# Patient Record
Sex: Female | Born: 1990 | Race: White | Hispanic: No | Smoking: Former smoker
Health system: Southern US, Community
[De-identification: ages and names within clinical notes are randomized; demographics above are authoritative.]

## PROBLEM LIST (undated history)

## (undated) DIAGNOSIS — R519 Headache, unspecified: Secondary | ICD-10-CM

## (undated) DIAGNOSIS — G8929 Other chronic pain: Secondary | ICD-10-CM

## (undated) DIAGNOSIS — Z3480 Encounter for supervision of other normal pregnancy, unspecified trimester: Secondary | ICD-10-CM

## (undated) DIAGNOSIS — S82209A Unspecified fracture of shaft of unspecified tibia, initial encounter for closed fracture: Secondary | ICD-10-CM

## (undated) DIAGNOSIS — IMO0001 Reserved for inherently not codable concepts without codable children: Secondary | ICD-10-CM

## (undated) DIAGNOSIS — F319 Bipolar disorder, unspecified: Secondary | ICD-10-CM

## (undated) DIAGNOSIS — M549 Dorsalgia, unspecified: Secondary | ICD-10-CM

## (undated) DIAGNOSIS — S82409A Unspecified fracture of shaft of unspecified fibula, initial encounter for closed fracture: Secondary | ICD-10-CM

## (undated) DIAGNOSIS — R51 Headache: Secondary | ICD-10-CM

## (undated) HISTORY — PX: ABDOMINAL SURGERY: SHX537

## (undated) HISTORY — DX: Reserved for inherently not codable concepts without codable children: IMO0001

## (undated) HISTORY — DX: Bipolar disorder, unspecified: F31.9

## (undated) HISTORY — PX: OTHER SURGICAL HISTORY: SHX169

## (undated) HISTORY — PX: THERAPEUTIC ABORTION: SHX798

## (undated) HISTORY — DX: Encounter for supervision of other normal pregnancy, unspecified trimester: Z34.80

---
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
## 2010-10-02 ENCOUNTER — Emergency Department (HOSPITAL_COMMUNITY)
Admission: EM | Admit: 2010-10-02 | Discharge: 2010-10-02 | Disposition: A | Discharge status: Home / Self Care | Payer: Self-pay | Attending: Emergency Medicine | Admitting: Emergency Medicine

## 2010-10-02 ENCOUNTER — Encounter: Payer: Self-pay | Admitting: Emergency Medicine

## 2010-10-02 DIAGNOSIS — F17200 Nicotine dependence, unspecified, uncomplicated: Secondary | ICD-10-CM | POA: Insufficient documentation

## 2010-10-02 DIAGNOSIS — H539 Unspecified visual disturbance: Secondary | ICD-10-CM | POA: Insufficient documentation

## 2010-10-02 DIAGNOSIS — R51 Headache: Secondary | ICD-10-CM | POA: Insufficient documentation

## 2010-10-02 DIAGNOSIS — H53149 Visual discomfort, unspecified: Secondary | ICD-10-CM | POA: Insufficient documentation

## 2010-10-02 LAB — URINALYSIS, ROUTINE W REFLEX MICROSCOPIC
Leukocytes, UA: NEGATIVE
Nitrite: NEGATIVE
Specific Gravity, Urine: 1.02 (ref 1.005–1.030)
Urobilinogen, UA: 1 mg/dL (ref 0.0–1.0)

## 2010-10-02 LAB — PREGNANCY, URINE: Preg Test, Ur: NEGATIVE

## 2010-10-02 LAB — GLUCOSE, CAPILLARY: Glucose-Capillary: 114 mg/dL — ABNORMAL HIGH (ref 70–99)

## 2010-10-02 MED ORDER — KETOROLAC TROMETHAMINE 30 MG/ML IJ SOLN
30.0000 mg | Freq: Once | INTRAMUSCULAR | Status: AC
  Administered 2010-10-02: 30 mg via INTRAVENOUS
  Filled 2010-10-02: qty 1

## 2010-10-02 MED ORDER — SODIUM CHLORIDE 0.9 % IV BOLUS (SEPSIS)
1000.0000 mL | Freq: Once | INTRAVENOUS | Status: AC
  Administered 2010-10-02: 1000 mL via INTRAVENOUS

## 2010-10-02 MED ORDER — METOCLOPRAMIDE HCL 5 MG/ML IJ SOLN
10.0000 mg | Freq: Once | INTRAMUSCULAR | Status: AC
  Administered 2010-10-02: 10 mg via INTRAVENOUS
  Filled 2010-10-02: qty 2

## 2010-10-02 MED ORDER — DIPHENHYDRAMINE HCL 50 MG/ML IJ SOLN
25.0000 mg | Freq: Once | INTRAMUSCULAR | Status: AC
  Administered 2010-10-02: 25 mg via INTRAVENOUS
  Filled 2010-10-02: qty 1

## 2010-10-02 NOTE — ED Notes (Signed)
Pt ambulatory to restroom, urine sample obtained, sent to lab, pt state that the pain is better, rates as a 3, delay explained to pt,comfort measures provided.

## 2010-10-02 NOTE — ED Notes (Signed)
Patient with c/o headache. Patient reports she took ibuprofen and the headache has eased but intermittently worsens. Patient reports shaking. "I think I might be diabetic, because I drank a drink and have been shaky since" "I was also disoriented before I took the ibuprofen". Reporting chills, hot flashes as well. Patient alert/oriented in triage.

## 2010-10-02 NOTE — ED Notes (Signed)
Pt states that she thinks she may have went into a diabetic coma, pt is not diabetic, states that she has had a headache, nausea for a week or so, shaking, and hot flashes. States that she has felt run down lately due to being in school and working. Pt denies any pregnancy

## 2010-10-02 NOTE — ED Provider Notes (Signed)
Scribed for Aimee Octave, MD, the patient was seen in room APA03/APA03. This chart was scribed by [employer]. The patient's care started at 15:49  CSN: [id number] Arrival date & time: 10/02/2010  3:44 PM  Chief Complaint  Patient presents with  . Headache   HPI Aimee Santiago is a 20 y.o. female who presents to the Emergency Department complaining of Headaches onset 09/29/2010. She reports taking a dose of ibuprofen without relief. Patient states she's been having hot flashes for about a week, felt very nauseated this morning and having blurry vision. She reports current headaches are different from what she has at baseline. She notes that she only gets headaches after she drinks caffeine or sugar containing drinks. Headache is intermittent and worsening. Patient works in a hot environment at [employer] and stands for 10 hours at a time. She reports having a CT scan done on her head when she was 20 years old. She currently has no PCP and has not been seen for headaches. There are no other associated symptoms and no other alleviating or aggravating factors.    HPI ELEMENTS:  Location: head  Onset: 09/29/2010 Duration: 3 days  Timing: intermittent Modifying factors: not alleviated by ibuprofen Context:  as above  Associated symptoms: as above    History reviewed. No pertinent past medical history.  History reviewed. No pertinent past surgical history.  No family history on file.  History  Substance Use Topics  . Smoking status: Current Everyday Smoker  . Smokeless tobacco: Not on file  . Alcohol Use: Yes     occasionally     OB History    Grav Para Term Preterm Abortions TAB SAB Ect Mult Living                  Review of Systems  Constitutional: Positive for chills.  Eyes: Positive for photophobia and visual disturbance.  Neurological: Positive for headaches. Negative for speech difficulty and light-headedness.  All other systems reviewed and are  negative.    Physical Exam  BP 127/92  Pulse 96  Temp(Src) 98.3 F (36.8 C) (Oral)  Resp 20  Ht 5\' 1"  (1.549 m)  Wt 120 lb (54.432 kg)  BMI 22.67 kg/m2  SpO2 100%  LMP 10/02/2010  Physical Exam  Constitutional: She is oriented to person, place, and time. She appears well-developed and well-nourished. No distress.  HENT:  Head: Normocephalic and atraumatic.  Eyes: Conjunctivae and EOM are normal. Pupils are equal, round, and reactive to light.  Neck: Normal range of motion. Neck supple. No tracheal deviation present.  Cardiovascular: Normal rate and regular rhythm.   Pulmonary/Chest: Effort normal. No respiratory distress. She has no wheezes. She has no rales.  Abdominal: Soft. Bowel sounds are normal. She exhibits no distension. There is no tenderness. There is no rebound and no guarding.  Musculoskeletal: Normal range of motion. She exhibits no edema and no tenderness.  Neurological: She is alert and oriented to person, place, and time. She has normal strength. No cranial nerve deficit or sensory deficit. GCS eye subscore is 4. GCS verbal subscore is 5. GCS motor subscore is 6.       5/5 strength No meningismus  Skin: Skin is warm and dry. No rash noted. No erythema.  Psychiatric: She has a normal mood and affect. Her behavior is normal.     ED Course  Procedures  OTHER DATA REVIEWED: Nursing notes, vital signs, and past medical records reviewed.    DIAGNOSTIC STUDIES: Oxygen  Saturation is 100% on room air, normal by my interpretation.    LABS / RADIOLOGY:  Results for orders placed during the hospital encounter of 10/02/10  GLUCOSE, CAPILLARY      Component Value Range   Glucose-Capillary 114 (*) 70 - 99 (mg/dL)  URINALYSIS, ROUTINE W REFLEX MICROSCOPIC      Component Value Range   Color, Urine YELLOW  YELLOW    Appearance CLOUDY (*) CLEAR    Specific Gravity, Urine 1.020  1.005 - 1.030    pH 7.5  5.0 - 8.0    Glucose, UA NEGATIVE  NEGATIVE (mg/dL)   Hgb  urine dipstick NEGATIVE  NEGATIVE    Bilirubin Urine NEGATIVE  NEGATIVE    Ketones, ur TRACE (*) NEGATIVE (mg/dL)   Protein, ur NEGATIVE  NEGATIVE (mg/dL)   Urobilinogen, UA 1.0  0.0 - 1.0 (mg/dL)   Nitrite NEGATIVE  NEGATIVE    Leukocytes, UA NEGATIVE  NEGATIVE   PREGNANCY, URINE      Component Value Range   Preg Test, Ur NEGATIVE       ED COURSE / COORDINATION OF CARE: 16:10 - EDMD examined patient and ordered the following Orders Placed This Encounter  Procedures  . Glucose, capillary  . Urinalysis with microscopic  . Pregnancy, urine  . In and Out Cath  . Insert peripheral IV    MDM: intermittent headaches for the past month, similar in character.  Associated with hot flashes, nausea, photophobia. Noticed a headache onset today while drinking a caffeinated beverage.  States drinks two caffeinated drinks daily.  Normal neuro exam, no meningismus.   Symptomatic improvement after headache cocktail.  Normal neuro exam. Stable for outpatient followup.  MEDICATIONS GIVEN IN THE E.D.  Medications  ibuprofen (ADVIL,MOTRIN) 200 MG tablet (not administered)  sodium chloride 0.9 % bolus 1,000 mL (1000 mL Intravenous Given 10/02/10 1643)  diphenhydrAMINE (BENADRYL) injection 25 mg (25 mg Intravenous Given 10/02/10 1643)  ketorolac (TORADOL) injection 30 mg (30 mg Intravenous Given 10/02/10 1642)  metoCLOPramide (REGLAN) injection 10 mg (10 mg Intravenous Given 10/02/10 1644)    SCRIBE ATTESTATION: I personally performed the services described in this documentation, which was scribed in my presence.  The recorded information has been reviewed and considered.      Aimee Octave, MD 10/02/10 [phone]

## 2010-10-02 NOTE — ED Notes (Signed)
Pt self ambulated wih a steady gait out of the er stating no needs

## 2012-09-06 ENCOUNTER — Ambulatory Visit (INDEPENDENT_AMBULATORY_CARE_PROVIDER_SITE_OTHER): Payer: BC Managed Care – PPO | Admitting: *Deleted

## 2012-09-06 DIAGNOSIS — Z23 Encounter for immunization: Secondary | ICD-10-CM

## 2012-09-06 NOTE — Patient Instructions (Addendum)
Measles, Mumps, Rubella (MMR) Vaccine What You Need to Know WHY GET VACCINATED? Measles, mumps, and rubella are serious diseases. Before vaccines they were very common, especially among children. Measles  Measles virus causes rash, cough, runny nose, eye irritation, and fever.  It can lead to ear infection, pneumonia, seizures (jerking and staring), brain damage, and death. Mumps  Mumps virus causes fever, headache, muscle pain, loss of appetite, and swollen glands.  It can lead to deafness, meningitis (infection of the brain and spinal cord covering), painful swelling of the testicles or ovaries, and rarely sterility. Rubella (German Measles)  Rubella virus causes rash, arthritis (mostly in women), and mild fever.  If a woman gets rubella while she is pregnant, she could have a miscarriage or her baby could be born with serious birth defects. These diseases spread from person to person through the air. You can easily catch them by being around someone who is already infected. Measles, mumps, and rubella (MMR) vaccine can protect children (and adults) from all three of these diseases. Thanks to successful vaccination programs these diseases are much less common in the [place] than they used to be. But if we stopped vaccinating they would return.  WHO SHOULD GET MMR VACCINE AND WHEN? Children should get 2 doses of MMR vaccine:  First Dose: 12-15 months of age.  Second Dose: 4-6 years of age (may be given earlier, if at least 28 days after the 1st dose). Some infants younger than 12 months should get a dose of MMR if they are traveling out of the country. (This dose will not count toward their routine series.) Some adults should also get MMR vaccine: Generally, anyone 18 years of age or older who was born after 1958 should get at least one dose of MMR vaccine, unless they can show that they have had either been vaccinated or had all three diseases. MMR vaccine may be given at the same time  as other vaccines. Children between 1 and 12 years of age can get a "combination" vaccine called MMRV, which contains both MMR and varicella (chickenpox) vaccines. There is a separate Vaccine Information Statement for MMRV. SOME PEOPLE SHOULD NOT GET MMR VACCINE OR SHOULD WAIT  Anyone who has ever had a life-threatening allergic reaction to the antibiotic neomycin, or any other component of MMR vaccine, should not get the vaccine. Tell your doctor if you have any severe allergies.  Anyone who had a life-threatening allergic reaction to a previous dose of MMR or MMRV vaccine should not get another dose.  Some people who are sick at the time the shot is scheduled may be advised to wait until they recover before getting MMR vaccine.  Pregnant women should not get MMR vaccine. Pregnant women who need the vaccine should wait until after giving birth. Women should avoid getting pregnant for 4 weeks after vaccination with MMR vaccine.  Tell your doctor if the person getting the vaccine:  Has HIV/AIDS, or another disease that affects the immune system.  Is being treated with drugs that affect the immune system, such as steroids.  Has any kind of cancer.  Is being treated for cancer with radiation or drugs.  Has ever had a low platelet count (a blood disorder).  Has gotten another vaccine within the past 4 weeks.  Has recently had a transfusion or received other blood products. Any of these might be a reason to not get the vaccine, or delay vaccination until later. WHAT ARE THE RISKS FROM MMR VACCINE?    A vaccine, like any medicine, is capable of causing serious problems, such as severe allergic reactions.  The risk of MMR vaccine causing serious harm, or death, is extremely small.  Getting MMR vaccine is much safer than getting measles, mumps, or rubella.  Most people who get MMR vaccine do not have any serious problems with it. Mild Problems  Fever (up to 1 person out of 6).  Mild  rash (about 1 person out of 20).  Swelling of glands in the cheeks or neck (about 1 person out of 75). If these problems occur, it is usually within 6-14 days after the shot. They occur less often after the second dose. Moderate Problems  Seizure (jerking or staring) caused by fever (about 1 out of 3,000 doses).  Temporary pain and stiffness in the joints, mostly in teenage or adult women (up to 1 out of 4).  Temporary low platelet count, which can cause a bleeding disorder (about 1 out of 30,000 doses). Severe Problems (Very Rare)  Serious allergic reaction (less than 1 out of a million doses).  Several other severe problems have been reported after a child gets MMR vaccine, including:  Deafness.  Long-term seizures, coma, or lowered consciousness.  Permanent brain damage. These are so rare that it is hard to tell whether they are caused by the vaccine.  WHAT IF THERE IS A SERIOUS REACTION? What should I look for? Any unusual condition, such as a high fever or unusual behavior. Signs of a serious allergic reaction can include difficulty breathing, hoarseness or wheezing, hives, paleness, weakness, a fast heartbeat or dizziness. What should I do?  Call a doctor, or get the person to a doctor right away.  Tell your doctor what happened, the date and time it happened, and when the vaccination was given.  Ask your doctor to report the reaction by filing a Vaccine Adverse Event Reporting System (VAERS) form. Or, you can file this report through the VAERS website at www.vaers.hhs.gov or by calling 1-800-822-7967. VAERS does not provide medical advice. THE NATIONAL VACCINE INJURY COMPENSATION PROGRAM The National Vaccine Injury Compensation Program (VICP) was created in 1986. Persons who believe they may have been injured by a vaccine can learn about the program and about filing a claim by calling 1-800-338-2382 or visiting the VICP website at www.hrsa.gov/vaccinecompensation. HOW CAN  I LEARN MORE?  Ask your doctor.  Call your local or state health department.  Contact the Centers for Disease Control and Prevention (CDC):  Call 1-800-232-4636 (1-800-CDC-INFO)  or  Visit CDC's website at www.cdc.gov/vaccines CDC Measles, Mumps, and Rubella (MMR) Interim VIS (05/22/10) Document Released: 11/15/2005 Document Revised: 04/12/2011 Document Reviewed: 05/22/2010 [employer] Patient Information 2014 [employer]. Tetanus, Diphtheria, Pertussis (Tdap) Vaccine What You Need to Know WHY GET VACCINATED? Tetanus, diphtheria and pertussis can be very serious diseases, even for adolescents and adults. Tdap vaccine can protect us from these diseases. TETANUS (Lockjaw) causes painful muscle tightening and stiffness, usually all over the body.  It can lead to tightening of muscles in the head and neck so you can't open your mouth, swallow, or sometimes even breathe. Tetanus kills about 1 out of 5 people who are infected. DIPHTHERIA can cause a thick coating to form in the back of the throat.  It can lead to breathing problems, paralysis, heart failure, and death. PERTUSSIS (Whooping Cough) causes severe coughing spells, which can cause difficulty breathing, vomiting and disturbed sleep.  It can also lead to weight loss, incontinence, and rib fractures. Up to   2 in 100 adolescents and 5 in 100 adults with pertussis are hospitalized or have complications, which could include pneumonia and death. These diseases are caused by bacteria. Diphtheria and pertussis are spread from person to person through coughing or sneezing. Tetanus enters the body through cuts, scratches, or wounds. Before vaccines, the [place] saw as many as 200,000 cases a year of diphtheria and pertussis, and hundreds of cases of tetanus. Since vaccination began, tetanus and diphtheria have dropped by about 99% and pertussis by about 80%. TDAP VACCINE Tdap vaccine can protect adolescents and adults from tetanus,  diphtheria, and pertussis. One dose of Tdap is routinely given at age 11 or 12. People who did not get Tdap at that age should get it as soon as possible. Tdap is especially important for health care professionals and anyone having close contact with a baby younger than 12 months. Pregnant women should get a dose of Tdap during every pregnancy, to protect the newborn from pertussis. Infants are most at risk for severe, life-threatening complications from pertussis. A similar vaccine, called Td, protects from tetanus and diphtheria, but not pertussis. A Td booster should be given every 10 years. Tdap may be given as one of these boosters if you have not already gotten a dose. Tdap may also be given after a severe cut or burn to prevent tetanus infection. Your doctor can give you more information. Tdap may safely be given at the same time as other vaccines. SOME PEOPLE SHOULD NOT GET THIS VACCINE  If you ever had a life-threatening allergic reaction after a dose of any tetanus, diphtheria, or pertussis containing vaccine, OR if you have a severe allergy to any part of this vaccine, you should not get Tdap. Tell your doctor if you have any severe allergies.  If you had a coma, or long or multiple seizures within 7 days after a childhood dose of DTP or DTaP, you should not get Tdap, unless a cause other than the vaccine was found. You can still get Td.  Talk to your doctor if you:  have epilepsy or another nervous system problem,  had severe pain or swelling after any vaccine containing diphtheria, tetanus or pertussis,  ever had Guillain-Barr Syndrome (GBS),  aren't feeling well on the day the shot is scheduled. RISKS OF A VACCINE REACTION With any medicine, including vaccines, there is a chance of side effects. These are usually mild and go away on their own, but serious reactions are also possible. Brief fainting spells can follow a vaccination, leading to injuries from falling. Sitting or  lying down for about 15 minutes can help prevent these. Tell your doctor if you feel dizzy or light-headed, or have vision changes or ringing in the ears. Mild problems following Tdap (Did not interfere with activities)  Pain where the shot was given (about 3 in 4 adolescents or 2 in 3 adults)  Redness or swelling where the shot was given (about 1 person in 5)  Mild fever of at least 100.4F (up to about 1 in 25 adolescents or 1 in 100 adults)  Headache (about 3 or 4 people in 10)  Tiredness (about 1 person in 3 or 4)  Nausea, vomiting, diarrhea, stomach ache (up to 1 in 4 adolescents or 1 in 10 adults)  Chills, body aches, sore joints, rash, swollen glands (uncommon) Moderate problems following Tdap (Interfered with activities, but did not require medical attention)  Pain where the shot was given (about 1 in 5 adolescents or   1 in 100 adults)  Redness or swelling where the shot was given (up to about 1 in 16 adolescents or 1 in 25 adults)  Fever over 102F (about 1 in 100 adolescents or 1 in 250 adults)  Headache (about 3 in 20 adolescents or 1 in 10 adults)  Nausea, vomiting, diarrhea, stomach ache (up to 1 or 3 people in 100)  Swelling of the entire arm where the shot was given (up to about 3 in 100). Severe problems following Tdap (Unable to perform usual activities, required medical attention)  Swelling, severe pain, bleeding and redness in the arm where the shot was given (rare). A severe allergic reaction could occur after any vaccine (estimated less than 1 in a million doses). WHAT IF THERE IS A SERIOUS REACTION? What should I look for?  Look for anything that concerns you, such as signs of a severe allergic reaction, very high fever, or behavior changes. Signs of a severe allergic reaction can include hives, swelling of the face and throat, difficulty breathing, a fast heartbeat, dizziness, and weakness. These would start a few minutes to a few hours after the  vaccination. What should I do?  If you think it is a severe allergic reaction or other emergency that can't wait, call 9-1-1 or get the person to the nearest hospital. Otherwise, call your doctor.  Afterward, the reaction should be reported to the "Vaccine Adverse Event Reporting System" (VAERS). Your doctor might file this report, or you can do it yourself through the VAERS web site at www.vaers.hhs.gov, or by calling 1-800-822-7967. VAERS is only for reporting reactions. They do not give medical advice.  THE NATIONAL VACCINE INJURY COMPENSATION PROGRAM The National Vaccine Injury Compensation Program (VICP) is a federal program that was created to compensate people who may have been injured by certain vaccines. Persons who believe they may have been injured by a vaccine can learn about the program and about filing a claim by calling 1-800-338-2382 or visiting the VICP website at www.hrsa.gov/vaccinecompensation. HOW CAN I LEARN MORE?  Ask your doctor.  Call your local or state health department.  Contact the Centers for Disease Control and Prevention (CDC):  Call 1-800-232-4636 or visit CDC's website at www.cdc.gov/vaccines. CDC Tdap Vaccine VIS (06/10/11) Document Released: 07/20/2011 Document Revised: 10/13/2011 Document Reviewed: 07/20/2011 [employer] Patient Information 2014 [employer].  

## 2012-09-06 NOTE — Progress Notes (Signed)
Patient tolerated well.

## 2012-10-31 ENCOUNTER — Other Ambulatory Visit: Payer: BC Managed Care – PPO | Admitting: Nurse Practitioner

## 2012-12-20 ENCOUNTER — Emergency Department (HOSPITAL_COMMUNITY): Payer: No Typology Code available for payment source

## 2012-12-20 ENCOUNTER — Encounter (HOSPITAL_COMMUNITY): Payer: Self-pay | Admitting: Emergency Medicine

## 2012-12-20 ENCOUNTER — Emergency Department (HOSPITAL_COMMUNITY): Payer: Medicaid Other

## 2012-12-20 ENCOUNTER — Emergency Department (HOSPITAL_COMMUNITY)
Admission: EM | Admit: 2012-12-20 | Discharge: 2012-12-21 | Disposition: A | Discharge status: Home / Self Care | Payer: Medicaid Other | Attending: Emergency Medicine | Admitting: Emergency Medicine

## 2012-12-20 DIAGNOSIS — IMO0002 Reserved for concepts with insufficient information to code with codable children: Secondary | ICD-10-CM | POA: Insufficient documentation

## 2012-12-20 DIAGNOSIS — S0990XA Unspecified injury of head, initial encounter: Secondary | ICD-10-CM | POA: Insufficient documentation

## 2012-12-20 DIAGNOSIS — Y9389 Activity, other specified: Secondary | ICD-10-CM | POA: Insufficient documentation

## 2012-12-20 DIAGNOSIS — S8990XA Unspecified injury of unspecified lower leg, initial encounter: Secondary | ICD-10-CM | POA: Insufficient documentation

## 2012-12-20 DIAGNOSIS — Z3202 Encounter for pregnancy test, result negative: Secondary | ICD-10-CM | POA: Insufficient documentation

## 2012-12-20 DIAGNOSIS — Y92410 Unspecified street and highway as the place of occurrence of the external cause: Secondary | ICD-10-CM | POA: Insufficient documentation

## 2012-12-20 DIAGNOSIS — F17200 Nicotine dependence, unspecified, uncomplicated: Secondary | ICD-10-CM | POA: Insufficient documentation

## 2012-12-20 DIAGNOSIS — F1010 Alcohol abuse, uncomplicated: Secondary | ICD-10-CM | POA: Insufficient documentation

## 2012-12-20 LAB — POCT PREGNANCY, URINE: Preg Test, Ur: NEGATIVE

## 2012-12-20 NOTE — ED Notes (Signed)
Pt standing in hallway with gown unbuttoned and is yelling for a cigarette and to call her mom.  Went in to speak with pt and asked her to sit back down on bed.  Encouraged pt to cooperate for test and offered to call pt's mother for her.  Called [phone] and pt left a message for her mother.  Also called [phone] for pt to leave a message for another family member.  I also called mother's phone number and left a message. Pt to radiology.

## 2012-12-20 NOTE — ED Notes (Signed)
Patient removed c-collar herself. Patient was found to be up and walking in the hall with her gown un-buttoned. Patient removed herself from the cardiac monitor also.
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
## 2012-12-21 ENCOUNTER — Other Ambulatory Visit (HOSPITAL_COMMUNITY): Payer: Self-pay

## 2012-12-21 ENCOUNTER — Emergency Department (HOSPITAL_COMMUNITY): Payer: No Typology Code available for payment source

## 2012-12-21 ENCOUNTER — Emergency Department (HOSPITAL_COMMUNITY): Payer: Medicaid Other

## 2012-12-21 MED ORDER — HYDROCODONE-ACETAMINOPHEN 5-325 MG PO TABS: 2.0000 | ORAL_TABLET | ORAL | Status: DC | PRN

## 2012-12-21 NOTE — ED Provider Notes (Signed)
Medical screening examination/treatment/procedure(s) were performed by non-physician practitioner and as supervising physician I was immediately available for consultation/collaboration.  EKG Interpretation   None        Raeford Razor, MD 12/21/12 [phone]

## 2012-12-21 NOTE — ED Notes (Signed)
Attempted to call patients family at this time- no answer.
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
## 2012-12-21 NOTE — ED Notes (Signed)
Talked to patients mother on the phone- updated her on her daughters ED visit, condition and events leading up to ED visit. Mother stated that she would be here to pick up her daughter at approx 0730. Charge nurse and patient made aware.

## 2012-12-21 NOTE — ED Notes (Signed)
Schinlever, PA. Gave verbal order to wait to discharge patient until further notice.

## 2013-06-21 ENCOUNTER — Emergency Department (HOSPITAL_COMMUNITY): Payer: Medicaid Other

## 2013-06-21 ENCOUNTER — Emergency Department (HOSPITAL_COMMUNITY)
Admission: EM | Admit: 2013-06-21 | Discharge: 2013-06-21 | Disposition: A | Discharge status: Home / Self Care | Payer: Medicaid Other | Attending: Emergency Medicine | Admitting: Emergency Medicine

## 2013-06-21 ENCOUNTER — Encounter (HOSPITAL_COMMUNITY): Payer: Self-pay | Admitting: Emergency Medicine

## 2013-06-21 DIAGNOSIS — G8929 Other chronic pain: Secondary | ICD-10-CM | POA: Insufficient documentation

## 2013-06-21 DIAGNOSIS — O2390 Unspecified genitourinary tract infection in pregnancy, unspecified trimester: Secondary | ICD-10-CM | POA: Insufficient documentation

## 2013-06-21 DIAGNOSIS — B9689 Other specified bacterial agents as the cause of diseases classified elsewhere: Secondary | ICD-10-CM | POA: Insufficient documentation

## 2013-06-21 DIAGNOSIS — M545 Low back pain, unspecified: Secondary | ICD-10-CM | POA: Insufficient documentation

## 2013-06-21 DIAGNOSIS — Z3490 Encounter for supervision of normal pregnancy, unspecified, unspecified trimester: Secondary | ICD-10-CM

## 2013-06-21 DIAGNOSIS — N760 Acute vaginitis: Secondary | ICD-10-CM | POA: Insufficient documentation

## 2013-06-21 DIAGNOSIS — Z8781 Personal history of (healed) traumatic fracture: Secondary | ICD-10-CM | POA: Insufficient documentation

## 2013-06-21 DIAGNOSIS — A499 Bacterial infection, unspecified: Secondary | ICD-10-CM | POA: Insufficient documentation

## 2013-06-21 DIAGNOSIS — O99330 Smoking (tobacco) complicating pregnancy, unspecified trimester: Secondary | ICD-10-CM | POA: Insufficient documentation

## 2013-06-21 DIAGNOSIS — N949 Unspecified condition associated with female genital organs and menstrual cycle: Secondary | ICD-10-CM | POA: Insufficient documentation

## 2013-06-21 DIAGNOSIS — O98819 Other maternal infectious and parasitic diseases complicating pregnancy, unspecified trimester: Secondary | ICD-10-CM | POA: Insufficient documentation

## 2013-06-21 HISTORY — DX: Other chronic pain: G89.29

## 2013-06-21 HISTORY — DX: Headache, unspecified: R51.9

## 2013-06-21 HISTORY — DX: Headache: R51

## 2013-06-21 HISTORY — DX: Unspecified fracture of shaft of unspecified tibia, initial encounter for closed fracture: S82.209A

## 2013-06-21 HISTORY — DX: Dorsalgia, unspecified: M54.9

## 2013-06-21 HISTORY — DX: Unspecified fracture of shaft of unspecified fibula, initial encounter for closed fracture: S82.409A

## 2013-06-21 LAB — URINALYSIS, ROUTINE W REFLEX MICROSCOPIC
Bilirubin Urine: NEGATIVE
GLUCOSE, UA: NEGATIVE mg/dL
HGB URINE DIPSTICK: NEGATIVE
Ketones, ur: NEGATIVE mg/dL
LEUKOCYTES UA: NEGATIVE
Nitrite: NEGATIVE
PH: 6 (ref 5.0–8.0)
Protein, ur: NEGATIVE mg/dL
SPECIFIC GRAVITY, URINE: 1.02 (ref 1.005–1.030)
Urobilinogen, UA: 0.2 mg/dL (ref 0.0–1.0)

## 2013-06-21 LAB — OB RESULTS CONSOLE GC/CHLAMYDIA
CHLAMYDIA, DNA PROBE: NEGATIVE
GC PROBE AMP, GENITAL: NEGATIVE

## 2013-06-21 LAB — CBC
HCT: 34.5 % — ABNORMAL LOW (ref 36.0–46.0)
HEMOGLOBIN: 12 g/dL (ref 12.0–15.0)
MCH: 30.8 pg (ref 26.0–34.0)
MCHC: 34.8 g/dL (ref 30.0–36.0)
MCV: 88.7 fL (ref 78.0–100.0)
PLATELETS: 265 10*3/uL (ref 150–400)
RBC: 3.89 MIL/uL (ref 3.87–5.11)
RDW: 12.3 % (ref 11.5–15.5)
WBC: 12.1 10*3/uL — ABNORMAL HIGH (ref 4.0–10.5)

## 2013-06-21 LAB — HCG, QUANTITATIVE, PREGNANCY: HCG, BETA CHAIN, QUANT, S: 103418 m[IU]/mL — AB (ref <5)

## 2013-06-21 LAB — WET PREP, GENITAL
TRICH WET PREP: NONE SEEN
YEAST WET PREP: NONE SEEN

## 2013-06-21 LAB — PREGNANCY, URINE: PREG TEST UR: POSITIVE — AB

## 2013-06-21 LAB — ABO/RH: ABO/RH(D): AB POS

## 2013-06-21 MED ORDER — PRENATAL COMPLETE 14-0.4 MG PO TABS: 1.0000 | ORAL_TABLET | Freq: Every day | ORAL | Status: DC

## 2013-06-21 MED ORDER — METRONIDAZOLE 500 MG PO TABS: 500.0000 mg | ORAL_TABLET | Freq: Two times a day (BID) | ORAL | Status: DC

## 2013-06-21 NOTE — ED Notes (Signed)
abd pain for 1 week, Has not seen MD yet, is pregnant 2mos.    No vag bleeding, Nausea, vomiting.  Has had 2 abortions in past, last 2 years ago.  Concerned she may be having twins

## 2013-06-21 NOTE — Discharge Instructions (Signed)
°Emergency Department Resource Guide °1) Find a Doctor and Pay Out of Pocket °Although you won't have to find out who is covered by your insurance plan, it is a good idea to ask around and get recommendations. You will then need to call the office and see if the doctor you have chosen will accept you as a new patient and what types of options they offer for patients who are self-pay. Some doctors offer discounts or will set up payment plans for their patients who do not have insurance, but you will need to ask so you aren't surprised when you get to your appointment. ° °2) Contact Your Local Health Department °Not all health departments have doctors that can see patients for sick visits, but many do, so it is worth a call to see if yours does. If you don't know where your local health department is, you can check in your phone book. The CDC also has a tool to help you locate your state's health department, and many state websites also have listings of all of their local health departments. ° °3) Find a Walk-in Clinic °If your illness is not likely to be very severe or complicated, you may want to try a walk in clinic. These are popping up all over the country in pharmacies, drugstores, and shopping centers. They're usually staffed by nurse practitioners or physician assistants that have been trained to treat common illnesses and complaints. They're usually fairly quick and inexpensive. However, if you have serious medical issues or chronic medical problems, these are probably not your best option. ° °No Primary Care Doctor: °- Call Health Connect at  832-8000 - they can help you locate a primary care doctor that  accepts your insurance, provides certain services, etc. °- Physician Referral Service- 1-800-533-3463 ° °Chronic Pain Problems: °Organization         Address  Phone   Notes  °[facility]  (336) 297-2271 Patients need to be referred by their primary care doctor.  ° °Medication  Assistance: °Organization         Address  Phone   Notes  °[place] [facility] [address] °[place], [place] [address] (336) 641-8030 --Must be a resident of [place] °-- Must have NO insurance coverage whatsoever (no Medicaid/ Medicare, etc.) °-- The pt. MUST have a primary care doctor that directs their care regularly and follows them in the community °  °[facility]  (866) 331-1348   °[employer]  (888) 892-1162   ° °Agencies that provide inexpensive medical care: °Organization         Address  Phone   Notes  °[facility]  (336) 832-8035   °[facility]    (336) 832-7272   °[facility] [address] °[place], [place] [address] (336) 832-4777   °[facility] [address], °[place] (336) 271-4999   °[facility]    (336) 373-0678   °[facility]    (336) 272-1050   °[facility] ° [address], [place] Phone:  (336) 832-4444, Fax:  (336) 832-4440 Hours of Operation:  9 am - 6 pm, M-F.  Also accepts Medicaid/Medicare and self-pay.  °[facility] ° [address], [place] Phone: (336) 832-3150, Fax: (336) [phone]. Hours of Operation:  8:30 am - 5:30 pm, M-F.  Also accepts Medicaid and self-pay.  °[facility] [place] [address], [place] Phone: (336) 878-6027   °[facility] [address], [place], [place] (336)723-1848, Ext. 123 Mondays & Thursdays: 7-9 AM.  First 15 patients are seen on a first come, first serve basis. °  ° °Medicaid-accepting [place] Providers: ° °Organization         Address  Phone   Notes  °[facility] [address], [place] (336) 641-2100 Also accepts self-pay patients.  °[facility] [address], [place] ° (336) 856-9996   °[facility] [address], [place]  (336) 288-8857   °[facility] [address] [place] [address], [place] (336) 299-7000   °Veita Bland [address], [place]  ° (336) 373-1557 Only accepts [place] Access Medicaid patients after they have their name applied to their card.  ° °Self-Pay (no insurance) in [place]: ° °Organization         Address  Phone   Notes  °Sickle Cell Patients, [facility] [address], [place] (336) 832-1970   °[facility] [address], [place] (336) 832-4400   °[facility] ° [address], [place] (336) 992-4800   °[facility]/Dr. Osei-Bonsu ° [address] [place] [address], [place] or [address], [place] (336) 841-8500 Phone number for both [place] and [place] locations is the same.  °[facility] [address], [place] (336) 299-0000   °[facility] [address] [place] [address], [place] or [address] (336) 852-7530 °(336) 878-2260   °[facility] [address], [place] (336) 350-1642, phone; (336) 294-5005, fax Sees patients 1st and 3rd Saturday of every month.  Must not qualify for public or private insurance (i.e. Medicaid, Medicare, [place] Health Choice, Veterans' Benefits) • Household income should be no more than 200% of the poverty level •The clinic cannot treat you if you are pregnant or think you are pregnant • Sexually transmitted diseases are not treated at the clinic.  ° ° °Dental Care: °Organization         Address  Phone  Notes  °[place] [facility] [address], [place] (336) 641-6152 Accepts children up to age 21 who are enrolled in Medicaid or [place] Health Choice; pregnant women with a Medicaid card; and children who have applied for Medicaid or [place] Health Choice, but were declined, whose parents can pay a reduced fee at time of service.  °[place]  [facility] [place]  [address], [place] (336) 641-7733 Accepts children up to age 21 who are enrolled in Medicaid or [place] Health Choice; pregnant women with a Medicaid card; and children who have applied for Medicaid or [place] Health Choice, but were declined, whose parents can pay a reduced fee at time of service.  °[facility] ° [address], [place] (336) 641-4533 Patients are seen by appointment only. Walk-ins are not accepted. [facility] will see patients 18 years of age and older. °Monday - Tuesday (8am-5pm) °Most Wednesdays (8:30-5pm) °$30 per visit, cash only  °[facility] ° [address], [place] (336) 641-4533 Patients are seen by appointment only. Walk-ins are not accepted. [facility] will see patients 18 years of age and older. °One   Wednesday Evening (Monthly: Volunteer Based).  $30 per visit, cash only  °[facility]  (919) 537-3737 for adults; Children under age 4, call Graduate Pediatric Dentistry at ([phone]) [phone]. Children aged 4-14, please call (919) [phone] to request a pediatric application. ° Dental services are provided in all areas of dental care including fillings, crowns and bridges, complete and partial dentures, implants, gum treatment, root canals, and extractions. Preventive care is also provided. Treatment is provided to both adults and children. °Patients are selected via a lottery and there is often a waiting list. °  °[facility] [address], °[place] ° (336) 763-8833 [email or web address] °  °[facility] [address], [place], [place] (336)723-1848, Ext. 123 Second and Fourth Thursday of each month, opens at 6:30 AM; Clinic ends at 9 AM.  Patients are seen on a first-come first-served basis, and a limited number are seen during each clinic.  ° °[facility] ° [address], [place], [place] (336) 723-7904    Eligibility Requirements °You must have lived in [place], [place], or [place] counties for at least the last three months. °  You cannot be eligible for state or federal sponsored healthcare insurance, including Veterans Administration, Medicaid, or Medicare. °  You generally cannot be eligible for healthcare insurance through your employer.  °  How to apply: °Eligibility screenings are held every Tuesday and Wednesday afternoon from 1:00 pm until 4:00 pm. You do not need an appointment for the interview!  °[facility] [address], [place], [place] 336-631-2330   °[place] [facility]  336-342-8273   °[place]  336-703-3100   °[facility]  336-570-6415   ° °Behavioral Health Resources in the Community: °Intensive Outpatient Programs °Organization         Address  Phone  Notes  °[facility] [address], [place], [place] 336-878-6098   °[facility] [address], [place], [place] 336-832-9800   °[facility] [address], [place], [place] ° 336-882-2125   °[facility] [address],  °[place], [place] 1-800-853-5163 or 336-641-4981   °Substance Abuse Resources °Organization         Address  Phone  Notes  °[facility]  336-882-2125   °[facility]  336-784-9470   °[facility]  336-285-9073   °[facility]  336-845-3988   °Residential & Outpatient Substance Abuse Program  1-800-659-3381   °Psychological Services °Organization         Address  Phone  Notes  °[facility]  336- 832-9600   °[facility]  336- 378-7881   °[facility] [address], [place] 1-800-853-5163 or 336-641-4981   ° °Mobile Crisis Teams °Organization         Address  Phone  Notes  °[facility], Mobile Crisis Care Unit  1-877-626-1772   °Assertive °[facility] ° [address]  [place], [place] 336-834-9664   °Sharon DeEsch [address] °[place] 336-554-5454   ° °Self-Help/Support Groups °Organization         Address  Phone             Notes  °[facility]oc[facility] of  - variety of support groups  336- 373-1402 Call for more information  °Narcotics Anonymous (NA), [facility] [address], °[place]  2 meetings at this location  ° °  Residential Treatment Programs Organization         Address  Phone  Notes  [facility] [address],    [place]  [phone]   [facility]  [address], [place] [phone], [place], [place] [phone]   [facility] [address], [place] [phone] Admissions: 8am-3pm M-F  [facility] [address]    [place], [place] [phone]([phone]   [facility] [address], [place], [place] [phone]   [facility] [address]  [place], [place] [phone]) [phone]   [facility] Intensive Outpatient [address], [place], [place] [phone]   [facility] ([facility] [address]  [place], [place] [phone] or [phone]   [facility] ([facility] [address] [place], [place] [phone] Accepts Medicaid  [facility] [address]  [place] [phone] Substance Abuse/Addiction Treatment   [facility] Resources Organization         Address  Phone  Notes  [facility]  740-704-2893(888) [phone]   Angie FavaJulie Brannon, PhD [address], [address] [place], [place]   [phone](336) [phone] or ([phone](336) [phone]   [facility]   [address] [place], [place] [phone](336) [phone]   [facility] [address], [place], [place] [phone](336) [phone] Insurance/Medicaid/sponsorship through [facility]  [facility] [address]                                    [place], [place] [phone]) [phone] Therapy/tele-psych/case    [facility] [address]   [place], [place] [phone](336) [phone]    Dr. Lolly MustacheArfeen  [phone](336) [phone]   [facility] [place] [facility]  [employer] [facility] 1) [address], [place] 2) [address], [place] 3)  [address], [place] [phone](336) [phone](336) [phone]  [phone](336) [phone]   [facility] Child Abuse Hotline [phone](336) [phone] or ([phone](336) [phone] (After Hours)       Your ultrasound today showed a twin intrauterine pregnancy appoximately between 6710 and 4111 weeks old.  Avoid strenuous activity and do NOT place anything into your vagina, ie: no douching, no tampons, no sexual intercourse, no swimming or tub baths, until you are seen in follow up by your regular OB/GYN doctor.  Call your regular OB/GYN doctor tomorrow morning to schedule a follow up appointment on Monday.  Return to the Emergency Department immediately if worsening.
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
## 2013-06-21 NOTE — ED Notes (Signed)
Pt c/o generalized abdominal pain x1 week. Pt states "I think I'm 2 or 3 months pregnant". Pt denies vaginal bleeding or discharge. Pt also reports intermirttnet v/d.

## 2013-06-22 LAB — GC/CHLAMYDIA PROBE AMP
CT PROBE, AMP APTIMA: NEGATIVE
GC Probe RNA: NEGATIVE

## 2013-07-02 ENCOUNTER — Other Ambulatory Visit: Payer: Self-pay | Admitting: Obstetrics and Gynecology

## 2013-07-02 DIAGNOSIS — O30009 Twin pregnancy, unspecified number of placenta and unspecified number of amniotic sacs, unspecified trimester: Secondary | ICD-10-CM

## 2013-07-04 ENCOUNTER — Other Ambulatory Visit: Payer: Self-pay | Admitting: Obstetrics and Gynecology

## 2013-07-04 ENCOUNTER — Encounter: Payer: Self-pay | Admitting: Obstetrics and Gynecology

## 2013-07-04 ENCOUNTER — Ambulatory Visit (INDEPENDENT_AMBULATORY_CARE_PROVIDER_SITE_OTHER): Payer: Medicaid Other

## 2013-07-04 ENCOUNTER — Other Ambulatory Visit: Payer: Medicaid Other

## 2013-07-04 DIAGNOSIS — O09299 Supervision of pregnancy with other poor reproductive or obstetric history, unspecified trimester: Secondary | ICD-10-CM

## 2013-07-04 DIAGNOSIS — Z3480 Encounter for supervision of other normal pregnancy, unspecified trimester: Secondary | ICD-10-CM

## 2013-07-04 DIAGNOSIS — Z36 Encounter for antenatal screening of mother: Secondary | ICD-10-CM

## 2013-07-04 DIAGNOSIS — O30009 Twin pregnancy, unspecified number of placenta and unspecified number of amniotic sacs, unspecified trimester: Secondary | ICD-10-CM

## 2013-07-04 LAB — CBC
HEMATOCRIT: 35.1 % — AB (ref 36.0–46.0)
HEMOGLOBIN: 12.2 g/dL (ref 12.0–15.0)
MCH: 30.6 pg (ref 26.0–34.0)
MCHC: 34.8 g/dL (ref 30.0–36.0)
MCV: 88 fL (ref 78.0–100.0)
Platelets: 298 10*3/uL (ref 150–400)
RBC: 3.99 MIL/uL (ref 3.87–5.11)
RDW: 13.1 % (ref 11.5–15.5)
WBC: 12 10*3/uL — AB (ref 4.0–10.5)

## 2013-07-04 NOTE — Progress Notes (Signed)
U/S(12+6wks)-Twin IUP monochorionic/diamniotic, cx appears closed, bilateral adnexa appears WNL, ALL posterior Gr 0 placenta, +Membrane noted, pt desires NT/IT screening Baby (A)- active, FHR-161 bpm, CRL c/w dates, NB present, NT-1.63mm, fluid WNL Baby (B)-active FHR- 167 bpm, CRL c/w dates, NB present, NT-1.53mm, fluid WNL  

## 2013-07-04 NOTE — Progress Notes (Signed)
U/S(12+6wks)-Twin IUP monochorionic/diamniotic, cx appears closed, bilateral adnexa appears WNL, ALL posterior Gr 0 placenta, +Membrane noted, pt desires NT/IT screening Baby (A)- active, FHR-161 bpm, CRL c/w dates, NB present, NT-1.12mm, fluid WNL Baby (B)-active FHR- 167 bpm, CRL c/w dates, NB present, NT-1.50mm, fluid WNL

## 2013-07-05 LAB — RPR

## 2013-07-05 LAB — RUBELLA SCREEN: RUBELLA: 2.28 {index} — AB (ref <0.90)

## 2013-07-05 LAB — ABO AND RH: Rh Type: POSITIVE

## 2013-07-05 LAB — HEPATITIS B SURFACE ANTIGEN: HEP B S AG: NEGATIVE

## 2013-07-05 LAB — HIV ANTIBODY (ROUTINE TESTING W REFLEX): HIV: NONREACTIVE

## 2013-07-05 LAB — ANTIBODY SCREEN: Antibody Screen: NEGATIVE

## 2013-07-05 LAB — VARICELLA ZOSTER ANTIBODY, IGG: Varicella IgG: 923.6 Index — ABNORMAL HIGH (ref <135.00)

## 2013-07-09 ENCOUNTER — Encounter: Payer: Self-pay | Admitting: Adult Health

## 2013-07-24 ENCOUNTER — Encounter: Payer: Self-pay | Admitting: Adult Health

## 2013-07-24 ENCOUNTER — Ambulatory Visit (INDEPENDENT_AMBULATORY_CARE_PROVIDER_SITE_OTHER): Payer: Medicaid Other | Admitting: Adult Health

## 2013-07-24 ENCOUNTER — Other Ambulatory Visit (HOSPITAL_COMMUNITY)
Admission: RE | Admit: 2013-07-24 | Discharge: 2013-07-24 | Disposition: A | Discharge status: Home / Self Care | Payer: Medicaid Other | Source: Ambulatory Visit | Attending: Family Medicine | Admitting: Family Medicine

## 2013-07-24 VITALS — BP 98/60 | Wt 125.5 lb

## 2013-07-24 DIAGNOSIS — O30009 Twin pregnancy, unspecified number of placenta and unspecified number of amniotic sacs, unspecified trimester: Secondary | ICD-10-CM

## 2013-07-24 DIAGNOSIS — O09899 Supervision of other high risk pregnancies, unspecified trimester: Secondary | ICD-10-CM | POA: Insufficient documentation

## 2013-07-24 DIAGNOSIS — Z01419 Encounter for gynecological examination (general) (routine) without abnormal findings: Secondary | ICD-10-CM | POA: Insufficient documentation

## 2013-07-24 DIAGNOSIS — Z1389 Encounter for screening for other disorder: Secondary | ICD-10-CM

## 2013-07-24 DIAGNOSIS — IMO0001 Reserved for inherently not codable concepts without codable children: Secondary | ICD-10-CM

## 2013-07-24 DIAGNOSIS — Z113 Encounter for screening for infections with a predominantly sexual mode of transmission: Secondary | ICD-10-CM | POA: Insufficient documentation

## 2013-07-24 DIAGNOSIS — Z3482 Encounter for supervision of other normal pregnancy, second trimester: Secondary | ICD-10-CM

## 2013-07-24 DIAGNOSIS — Z3480 Encounter for supervision of other normal pregnancy, unspecified trimester: Secondary | ICD-10-CM

## 2013-07-24 DIAGNOSIS — Z331 Pregnant state, incidental: Secondary | ICD-10-CM

## 2013-07-24 HISTORY — DX: Reserved for inherently not codable concepts without codable children: IMO0001

## 2013-07-24 HISTORY — DX: Encounter for supervision of other normal pregnancy, unspecified trimester: Z34.80

## 2013-07-24 LAB — URINALYSIS
BILIRUBIN URINE: NEGATIVE
Glucose, UA: NEGATIVE mg/dL
Hgb urine dipstick: NEGATIVE
Ketones, ur: NEGATIVE mg/dL
Leukocytes, UA: NEGATIVE
Nitrite: NEGATIVE
PH: 7 (ref 5.0–8.0)
PROTEIN: NEGATIVE mg/dL
SPECIFIC GRAVITY, URINE: 1.006 (ref 1.005–1.030)
Urobilinogen, UA: 0.2 mg/dL (ref 0.0–1.0)

## 2013-07-24 LAB — POCT URINALYSIS DIPSTICK
Glucose, UA: NEGATIVE
Ketones, UA: NEGATIVE
Leukocytes, UA: NEGATIVE
Nitrite, UA: NEGATIVE
Protein, UA: NEGATIVE
RBC UA: NEGATIVE

## 2013-07-24 MED ORDER — PRENATAL COMPLETE 14-0.4 MG PO TABS: 1.0000 | ORAL_TABLET | Freq: Every day | ORAL | Status: DC
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
## 2013-07-24 NOTE — Patient Instructions (Addendum)
Second Trimester of Pregnancy The second trimester is from week 13 through week 28, months 4 through 6. The second trimester is often a time when you feel your best. Your body has also adjusted to being pregnant, and you begin to feel better physically. Usually, morning sickness has lessened or quit completely, you may have more energy, and you may have an increase in appetite. The second trimester is also a time when the fetus is growing rapidly. At the end of the sixth month, the fetus is about 9 inches long and weighs about 1 pounds. You will likely begin to feel the baby move (quickening) between 18 and 20 weeks of the pregnancy. BODY CHANGES Your body goes through many changes during pregnancy. The changes vary from woman to woman.   Your weight will continue to increase. You will notice your lower abdomen bulging out.  You may begin to get stretch marks on your hips, abdomen, and breasts.  You may develop headaches that can be relieved by medicines approved by your health care provider.  You may urinate more often because the fetus is pressing on your bladder.  You may develop or continue to have heartburn as a result of your pregnancy.  You may develop constipation because certain hormones are causing the muscles that push waste through your intestines to slow down.  You may develop hemorrhoids or swollen, bulging veins (varicose veins).  You may have back pain because of the weight gain and pregnancy hormones relaxing your joints between the bones in your pelvis and as a result of a shift in weight and the muscles that support your balance.  Your breasts will continue to grow and be tender.  Your gums may bleed and may be sensitive to brushing and flossing.  Dark spots or blotches (chloasma, mask of pregnancy) may develop on your face. This will likely fade after the baby is born.  A dark line from your belly button to the pubic area (linea nigra) may appear. This will likely fade  after the baby is born.  You may have changes in your hair. These can include thickening of your hair, rapid growth, and changes in texture. Some women also have hair loss during or after pregnancy, or hair that feels dry or thin. Your hair will most likely return to normal after your baby is born. WHAT TO EXPECT AT YOUR PRENATAL VISITS During a routine prenatal visit:  You will be weighed to make sure you and the fetus are growing normally.  Your blood pressure will be taken.  Your abdomen will be measured to track your baby's growth.  The fetal heartbeat will be listened to.  Any test results from the previous visit will be discussed. Your health care provider may ask you:  How you are feeling.  If you are feeling the baby move.  If you have had any abnormal symptoms, such as leaking fluid, bleeding, severe headaches, or abdominal cramping.  If you have any questions. Other tests that may be performed during your second trimester include:  Blood tests that check for:  Low iron levels (anemia).  Gestational diabetes (between 24 and 28 weeks).  Rh antibodies.  Urine tests to check for infections, diabetes, or protein in the urine.  An ultrasound to confirm the proper growth and development of the baby.  An amniocentesis to check for possible genetic problems.  Fetal screens for spina bifida and Down syndrome. HOME CARE INSTRUCTIONS   Avoid all smoking, herbs, alcohol, and unprescribed   drugs. These chemicals affect the formation and growth of the baby.  Follow your health care provider's instructions regarding medicine use. There are medicines that are either safe or unsafe to take during pregnancy.  Exercise only as directed by your health care provider. Experiencing uterine cramps is a good sign to stop exercising.  Continue to eat regular, healthy meals.  Wear a good support bra for breast tenderness.  Do not use hot tubs, steam rooms, or saunas.  Wear your  seat belt at all times when driving.  Avoid raw meat, uncooked cheese, cat litter boxes, and soil used by cats. These carry germs that can cause birth defects in the baby.  Take your prenatal vitamins.  Try taking a stool softener (if your health care provider approves) if you develop constipation. Eat more high-fiber foods, such as fresh vegetables or fruit and whole grains. Drink plenty of fluids to keep your urine clear or pale yellow.  Take warm sitz baths to soothe any pain or discomfort caused by hemorrhoids. Use hemorrhoid cream if your health care provider approves.  If you develop varicose veins, wear support hose. Elevate your feet for 15 minutes, 3-4 times a day. Limit salt in your diet.  Avoid heavy lifting, wear low heel shoes, and practice good posture.  Rest with your legs elevated if you have leg cramps or low back pain.  Visit your dentist if you have not gone yet during your pregnancy. Use a soft toothbrush to brush your teeth and be gentle when you floss.  A sexual relationship may be continued unless your health care provider directs you otherwise.  Continue to go to all your prenatal visits as directed by your health care provider. SEEK MEDICAL CARE IF:   You have dizziness.  You have mild pelvic cramps, pelvic pressure, or nagging pain in the abdominal area.  You have persistent nausea, vomiting, or diarrhea.  You have a bad smelling vaginal discharge.  You have pain with urination. SEEK IMMEDIATE MEDICAL CARE IF:   You have a fever.  You are leaking fluid from your vagina.  You have spotting or bleeding from your vagina.  You have severe abdominal cramping or pain.  You have rapid weight gain or loss.  You have shortness of breath with chest pain.  You notice sudden or extreme swelling of your face, hands, ankles, feet, or legs.  You have not felt your baby move in over an hour.  You have severe headaches that do not go away with  medicine.  You have vision changes. Document Released: 01/12/2001 Document Revised: 01/23/2013 Document Reviewed: 03/21/2012 [facility] Patient Information 2015 [place]. This information is not intended to replace advice given to you by your health care provider. Make sure you discuss any questions you have with your health care provider. Return in 3 weeks for US and see Dr Despina HiddenEure

## 2013-07-25 LAB — DRUG SCREEN, URINE, NO CONFIRMATION
Amphetamine Screen, Ur: NEGATIVE
BENZODIAZEPINES.: NEGATIVE
Barbiturate Quant, Ur: NEGATIVE
CREATININE, U: 30.3 mg/dL
Cocaine Metabolites: NEGATIVE
METHADONE: NEGATIVE
Marijuana Metabolite: NEGATIVE
Opiate Screen, Urine: NEGATIVE
PHENCYCLIDINE (PCP): NEGATIVE
PROPOXYPHENE: NEGATIVE

## 2013-07-25 LAB — OXYCODONE SCREEN, UA, RFLX CONFIRM: Oxycodone Screen, Ur: NEGATIVE ng/mL

## 2013-07-26 LAB — URINE CULTURE
COLONY COUNT: NO GROWTH
ORGANISM ID, BACTERIA: NO GROWTH

## 2013-07-26 LAB — MATERNAL SCREEN, INTEGRATED #1

## 2013-07-27 LAB — CYSTIC FIBROSIS DIAGNOSTIC STUDY

## 2013-07-27 LAB — CYTOLOGY - PAP

## 2013-07-30 ENCOUNTER — Encounter: Payer: Self-pay | Admitting: Adult Health

## 2013-07-31 LAB — MATERNAL SCREEN, INTEGRATED #2
AFP MOM MAT SCREEN: 1.43
AFP, Serum: 48.4 ng/mL
Age risk Down Syndrome: 1:1000 {titer}
CROWN RUMP LENGTH MAT SCREEN 2: 65.6 mm
Calculated Gestational Age: 15.4
Estriol Mom: 1.59
Estriol, Free: 1.2 ng/mL
HCG, MOM MAT SCREEN: 2.04
HCG, SERUM MAT SCREEN: 91.3 [IU]/mL
INHIBIN A DIMERIC MAT SCREEN: 304 pg/mL
Inhibin A MoM: 1.57
MSS Down Syndrome: <1:5000 {titer}
NT MoM: 1.1
NUCHAL TRANSLUCENCY MAT SCREEN 2: 1.63 mm
Number of fetuses: 2
PAPP-A MOM MAT SCREEN: 1.55
PAPP-A: 1261 ng/mL

## 2013-08-01 ENCOUNTER — Encounter: Payer: Self-pay | Admitting: Adult Health

## 2013-08-06 ENCOUNTER — Other Ambulatory Visit: Payer: Self-pay | Admitting: Adult Health

## 2013-08-06 DIAGNOSIS — O30009 Twin pregnancy, unspecified number of placenta and unspecified number of amniotic sacs, unspecified trimester: Secondary | ICD-10-CM

## 2013-08-14 ENCOUNTER — Ambulatory Visit (INDEPENDENT_AMBULATORY_CARE_PROVIDER_SITE_OTHER): Payer: Medicaid Other | Admitting: Obstetrics & Gynecology

## 2013-08-14 ENCOUNTER — Ambulatory Visit (INDEPENDENT_AMBULATORY_CARE_PROVIDER_SITE_OTHER): Payer: Medicaid Other

## 2013-08-14 ENCOUNTER — Other Ambulatory Visit: Payer: Self-pay | Admitting: Adult Health

## 2013-08-14 VITALS — BP 110/76 | Wt 131.0 lb

## 2013-08-14 DIAGNOSIS — O30009 Twin pregnancy, unspecified number of placenta and unspecified number of amniotic sacs, unspecified trimester: Secondary | ICD-10-CM

## 2013-08-14 DIAGNOSIS — IMO0001 Reserved for inherently not codable concepts without codable children: Secondary | ICD-10-CM

## 2013-08-14 DIAGNOSIS — Z331 Pregnant state, incidental: Secondary | ICD-10-CM

## 2013-08-14 DIAGNOSIS — O09899 Supervision of other high risk pregnancies, unspecified trimester: Secondary | ICD-10-CM

## 2013-08-14 DIAGNOSIS — O09299 Supervision of pregnancy with other poor reproductive or obstetric history, unspecified trimester: Secondary | ICD-10-CM

## 2013-08-14 DIAGNOSIS — Z1389 Encounter for screening for other disorder: Secondary | ICD-10-CM

## 2013-08-14 LAB — POCT URINALYSIS DIPSTICK
Blood, UA: NEGATIVE
Glucose, UA: NEGATIVE
KETONES UA: NEGATIVE
LEUKOCYTES UA: NEGATIVE
NITRITE UA: NEGATIVE
PROTEIN UA: NEGATIVE

## 2013-08-14 MED ORDER — METRONIDAZOLE 500 MG PO TABS: 500.0000 mg | ORAL_TABLET | Freq: Two times a day (BID) | ORAL | Status: DC

## 2013-08-14 MED ORDER — PRENATAL COMPLETE 14-0.4 MG PO TABS: 1.0000 | ORAL_TABLET | Freq: Every day | ORAL | Status: DC

## 2013-08-14 NOTE — Progress Notes (Signed)
U/S(18+5wks) Twin IUP Monochorionic Diamniotic +Membrane noted, cx appears closed (3.1cm), bilateral adnexa appears WNL, all posterior Gr  0 placenta Baby A-vtx active fetus (caudal and to maternal Lt) , meas c/w dates, fluid WNL, FHR-144 BPM, no obvious abnl noted, female fetus Baby B- vtx active fetus (cephalic and to maternal Rt), meas c/w dates, fluid WNL, FHR-153 bpm, 2VC noted, Lt Vent EICF noted no other abnl noted, female fetus

## 2013-08-14 NOTE — Addendum Note (Signed)
Addended by: Lazaro ArmsEURE, Waylon Hershey H on: 08/14/2013 03:35 PM   Modules accepted: Orders

## 2013-08-14 NOTE — Progress Notes (Signed)
Sonogram is read reviewed and report done High Risk Pregnancy Diagnosis(es):   Twins  G3P0020 2421w5d Estimated Date of Delivery: 01/10/14  Blood pressure 110/76, weight 131 lb (59.421 kg), last menstrual period 04/05/2013.  Urinalysis: Negative   HPI: Sonogram discussed     BP weight and urine results all reviewed and noted. Patient reports good fetal movement, denies any bleeding and no rupture of membranes symptoms or regular contractions.  Fundal Height:  24  Fetal Heart rate:  144 153 Edema:  none  Patient is without complaints. All questions were answered.  Assessment:  3621w5d,   twins  Medication(s) Plans:  na  Treatment Plan:  Per usual twins  Follow up in 4 weeks for OB appt,

## 2013-09-11 ENCOUNTER — Encounter: Payer: Self-pay | Admitting: Obstetrics & Gynecology

## 2013-09-11 ENCOUNTER — Ambulatory Visit (INDEPENDENT_AMBULATORY_CARE_PROVIDER_SITE_OTHER): Payer: Medicaid Other | Admitting: Obstetrics & Gynecology

## 2013-09-11 VITALS — BP 120/72 | Wt 141.0 lb

## 2013-09-11 DIAGNOSIS — Z331 Pregnant state, incidental: Secondary | ICD-10-CM

## 2013-09-11 DIAGNOSIS — O30009 Twin pregnancy, unspecified number of placenta and unspecified number of amniotic sacs, unspecified trimester: Secondary | ICD-10-CM

## 2013-09-11 DIAGNOSIS — Z1389 Encounter for screening for other disorder: Secondary | ICD-10-CM

## 2013-09-11 LAB — POCT URINALYSIS DIPSTICK
Blood, UA: NEGATIVE
Glucose, UA: NEGATIVE
Ketones, UA: NEGATIVE
LEUKOCYTES UA: NEGATIVE
NITRITE UA: NEGATIVE
Protein, UA: NEGATIVE

## 2013-09-11 NOTE — Progress Notes (Signed)
High Risk Pregnancy Diagnosis(es):   Di/Mono twins  G3P0020 5148w5d Estimated Date of Delivery: 01/10/14  Blood pressure 120/72, weight 141 lb (63.957 kg), last menstrual period 04/05/2013.  Urinalysis: Negative   HPI: C/o left rib pain     BP weight and urine results all reviewed and noted. Patient reports good fetal movement, denies any bleeding and no rupture of membranes symptoms or regular contractions.  Fundal Height:  32 Fetal Heart rate:  145/155 Edema:  none  Patient is without complaints. All questions were answered.  Assessment:  7148w5d,   twins  Medication(s) Plans:  na  Treatment Plan:  Pt reminded to make plans to stop work at 28 weeks Monthly sonogram and twice weekly surveillance beginning at 32 weeks  Follow up in 4 weeks for OB appt, ob visit

## 2013-09-11 NOTE — Progress Notes (Signed)
Pt wants to discuss a problem she is having with her left side/rib area.

## 2013-09-20 ENCOUNTER — Encounter (HOSPITAL_COMMUNITY): Payer: Self-pay | Admitting: Pediatrics

## 2013-09-20 ENCOUNTER — Inpatient Hospital Stay (HOSPITAL_COMMUNITY)
Admission: AD | Admit: 2013-09-20 | Discharge: 2013-10-03 | DRG: 765 | Disposition: A | Discharge status: Home / Self Care | Payer: Medicaid Other | Source: Ambulatory Visit | Attending: Obstetrics & Gynecology | Admitting: Obstetrics & Gynecology

## 2013-09-20 ENCOUNTER — Encounter (HOSPITAL_COMMUNITY): Payer: Self-pay | Admitting: Anesthesiology

## 2013-09-20 ENCOUNTER — Telehealth: Payer: Self-pay | Admitting: Advanced Practice Midwife

## 2013-09-20 DIAGNOSIS — O4700 False labor before 37 completed weeks of gestation, unspecified trimester: Secondary | ICD-10-CM | POA: Diagnosis present

## 2013-09-20 DIAGNOSIS — Z22330 Carrier of Group B streptococcus: Secondary | ICD-10-CM

## 2013-09-20 DIAGNOSIS — O99344 Other mental disorders complicating childbirth: Secondary | ICD-10-CM | POA: Diagnosis present

## 2013-09-20 DIAGNOSIS — O30032 Twin pregnancy, monochorionic/diamniotic, second trimester: Secondary | ICD-10-CM

## 2013-09-20 DIAGNOSIS — O9989 Other specified diseases and conditions complicating pregnancy, childbirth and the puerperium: Secondary | ICD-10-CM

## 2013-09-20 DIAGNOSIS — O3090 Multiple gestation, unspecified, unspecified trimester: Secondary | ICD-10-CM | POA: Diagnosis present

## 2013-09-20 DIAGNOSIS — O30009 Twin pregnancy, unspecified number of placenta and unspecified number of amniotic sacs, unspecified trimester: Secondary | ICD-10-CM | POA: Diagnosis present

## 2013-09-20 DIAGNOSIS — F1210 Cannabis abuse, uncomplicated: Secondary | ICD-10-CM | POA: Diagnosis present

## 2013-09-20 DIAGNOSIS — Z98891 History of uterine scar from previous surgery: Secondary | ICD-10-CM

## 2013-09-20 DIAGNOSIS — F319 Bipolar disorder, unspecified: Secondary | ICD-10-CM | POA: Diagnosis present

## 2013-09-20 DIAGNOSIS — Z3689 Encounter for other specified antenatal screening: Secondary | ICD-10-CM

## 2013-09-20 DIAGNOSIS — O99892 Other specified diseases and conditions complicating childbirth: Secondary | ICD-10-CM | POA: Diagnosis present

## 2013-09-20 DIAGNOSIS — O99334 Smoking (tobacco) complicating childbirth: Secondary | ICD-10-CM | POA: Diagnosis present

## 2013-09-20 DIAGNOSIS — O09899 Supervision of other high risk pregnancies, unspecified trimester: Secondary | ICD-10-CM

## 2013-09-20 DIAGNOSIS — O329XX0 Maternal care for malpresentation of fetus, unspecified, not applicable or unspecified: Secondary | ICD-10-CM

## 2013-09-20 DIAGNOSIS — O30049 Twin pregnancy, dichorionic/diamniotic, unspecified trimester: Secondary | ICD-10-CM

## 2013-09-20 DIAGNOSIS — O4290 Premature rupture of membranes, unspecified as to length of time between rupture and onset of labor, unspecified weeks of gestation: Secondary | ICD-10-CM | POA: Diagnosis present

## 2013-09-20 DIAGNOSIS — IMO0001 Reserved for inherently not codable concepts without codable children: Secondary | ICD-10-CM

## 2013-09-20 MED ORDER — MAGNESIUM SULFATE 40 G IN LACTATED RINGERS - SIMPLE
2.0000 g/h | INTRAVENOUS | Status: DC
  Administered 2013-09-20 – 2013-09-21 (×2): 4 g/h via INTRAVENOUS
  Administered 2013-09-22 – 2013-09-23 (×2): 2 g/h via INTRAVENOUS
  Filled 2013-09-20 (×4): qty 500

## 2013-09-20 MED ORDER — TERBUTALINE SULFATE 1 MG/ML IJ SOLN
INTRAMUSCULAR | Status: AC
  Administered 2013-09-20: 23:00:00
  Filled 2013-09-20: qty 1

## 2013-09-20 MED ORDER — TERBUTALINE SULFATE 1 MG/ML IJ SOLN
0.2500 mg | Freq: Once | INTRAMUSCULAR | Status: DC
  Filled 2013-09-20: qty 1

## 2013-09-20 MED ORDER — LACTATED RINGERS IV SOLN: 500.0000 mL | INTRAVENOUS | Status: DC | PRN

## 2013-09-20 MED ORDER — OXYTOCIN 40 UNITS IN LACTATED RINGERS INFUSION - SIMPLE MED: 62.5000 mL/h | INTRAVENOUS | Status: DC

## 2013-09-20 MED ORDER — ONDANSETRON HCL 4 MG/2ML IJ SOLN: 4.0000 mg | Freq: Four times a day (QID) | INTRAMUSCULAR | Status: DC | PRN

## 2013-09-20 MED ORDER — NIFEDIPINE 10 MG PO CAPS
10.0000 mg | ORAL_CAPSULE | Freq: Three times a day (TID) | ORAL | Status: DC
  Administered 2013-09-21 – 2013-09-23 (×10): 10 mg via ORAL
  Filled 2013-09-20 (×10): qty 1

## 2013-09-20 MED ORDER — IBUPROFEN 600 MG PO TABS: 600.0000 mg | ORAL_TABLET | Freq: Four times a day (QID) | ORAL | Status: DC | PRN

## 2013-09-20 MED ORDER — INDOMETHACIN 25 MG PO CAPS
50.0000 mg | ORAL_CAPSULE | Freq: Once | ORAL | Status: AC
  Administered 2013-09-20: 50 mg via ORAL
  Filled 2013-09-20: qty 2

## 2013-09-20 MED ORDER — BETAMETHASONE SOD PHOS & ACET 6 (3-3) MG/ML IJ SUSP
12.0000 mg | Freq: Once | INTRAMUSCULAR | Status: AC
  Administered 2013-09-20: 12 mg via INTRAMUSCULAR
  Filled 2013-09-20: qty 2

## 2013-09-20 MED ORDER — SODIUM CHLORIDE 0.9 % IV SOLN
2.0000 g | Freq: Once | INTRAVENOUS | Status: AC
  Administered 2013-09-20: 2 g via INTRAVENOUS
  Filled 2013-09-20: qty 2000

## 2013-09-20 MED ORDER — LACTATED RINGERS IV SOLN
INTRAVENOUS | Status: DC
  Administered 2013-09-22 – 2013-09-30 (×15): via INTRAVENOUS

## 2013-09-20 MED ORDER — INDOMETHACIN 25 MG PO CAPS
25.0000 mg | ORAL_CAPSULE | Freq: Four times a day (QID) | ORAL | Status: DC
  Administered 2013-09-21 – 2013-09-23 (×9): 25 mg via ORAL
  Filled 2013-09-20 (×10): qty 1

## 2013-09-20 MED ORDER — MAGNESIUM SULFATE BOLUS VIA INFUSION
6.0000 g | Freq: Once | INTRAVENOUS | Status: DC
  Filled 2013-09-20: qty 500

## 2013-09-20 MED ORDER — ACETAMINOPHEN 325 MG PO TABS: 650.0000 mg | ORAL_TABLET | ORAL | Status: DC | PRN

## 2013-09-20 MED ORDER — CITRIC ACID-SODIUM CITRATE 334-500 MG/5ML PO SOLN
30.0000 mL | ORAL | Status: DC | PRN
Start: 2013-09-20 — End: 2013-09-30
  Administered 2013-09-30: 30 mL via ORAL
  Filled 2013-09-20: qty 15

## 2013-09-20 MED ORDER — LIDOCAINE HCL (PF) 1 % IJ SOLN: 30.0000 mL | INTRAMUSCULAR | Status: DC | PRN

## 2013-09-20 MED ORDER — OXYTOCIN BOLUS FROM INFUSION: 500.0000 mL | INTRAVENOUS | Status: DC

## 2013-09-20 MED ORDER — OXYCODONE-ACETAMINOPHEN 5-325 MG PO TABS: 1.0000 | ORAL_TABLET | ORAL | Status: DC | PRN

## 2013-09-20 NOTE — MAU Note (Signed)
Pt. sts she started having cramping around 1400 today. Then at 1700 she started feeling strong contractions.

## 2013-09-20 NOTE — Anesthesia Preprocedure Evaluation (Deleted)
Anesthesia Evaluation  Patient identified by MRN, date of birth, ID band Patient awake    Reviewed: Allergy & Precautions, H&P , Patient's Chart, lab work & pertinent test results  Airway Mallampati: II TM Distance: >3 FB Neck ROM: full    Dental   Pulmonary Current Smoker,  breath sounds clear to auscultation        Cardiovascular Rhythm:regular Rate:Normal     Neuro/Psych  Headaches, PSYCHIATRIC DISORDERS    GI/Hepatic   Endo/Other    Renal/GU      Musculoskeletal   Abdominal   Peds  Hematology   Anesthesia Other Findings Chronic headaches     Chronic back pain         Bipolar 1 disorder  Twins (breech vertex)  Reproductive/Obstetrics (+) Pregnancy                           Anesthesia Physical Anesthesia Plan  ASA: II  Anesthesia Plan: Epidural   Post-op Pain Management:    Induction:   Airway Management Planned:   Additional Equipment:   Intra-op Plan:   Post-operative Plan:   Informed Consent: I have reviewed the patients History and Physical, chart, labs and discussed the procedure including the risks, benefits and alternatives for the proposed anesthesia with the patient or authorized representative who has indicated his/her understanding and acceptance.     Plan Discussed with:   Anesthesia Plan Comments:         Anesthesia Quick Evaluation

## 2013-09-20 NOTE — Telephone Encounter (Signed)
Spoke with pt. Pt states she is having pain in vagina, period cramps, and a tightness in her belly. Also, leaking fluid. + baby movement. No bleeding. I [facility] pt [facility]. Pt states her husband will be home in about 1 hour and she can go then. Until then, I advised pt to push fluids, and lay on left side. Pt voiced understanding. JSY
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
## 2013-09-21 LAB — TYPE AND SCREEN
ABO/RH(D): AB POS
Antibody Screen: NEGATIVE

## 2013-09-21 LAB — CBC
HCT: 27 % — ABNORMAL LOW (ref 36.0–46.0)
HEMOGLOBIN: 9.3 g/dL — AB (ref 12.0–15.0)
MCH: 32.2 pg (ref 26.0–34.0)
MCHC: 34.4 g/dL (ref 30.0–36.0)
MCV: 93.4 fL (ref 78.0–100.0)
Platelets: 252 10*3/uL (ref 150–400)
RBC: 2.89 MIL/uL — ABNORMAL LOW (ref 3.87–5.11)
RDW: 13.5 % (ref 11.5–15.5)
WBC: 25.9 10*3/uL — ABNORMAL HIGH (ref 4.0–10.5)

## 2013-09-21 LAB — RAPID URINE DRUG SCREEN, HOSP PERFORMED
Amphetamines: NOT DETECTED
BARBITURATES: NOT DETECTED
Benzodiazepines: NOT DETECTED
Cocaine: NOT DETECTED
Opiates: NOT DETECTED
Tetrahydrocannabinol: POSITIVE — AB

## 2013-09-21 LAB — OB RESULTS CONSOLE GC/CHLAMYDIA
Chlamydia: NEGATIVE
Gonorrhea: NEGATIVE

## 2013-09-21 LAB — ABO/RH: ABO/RH(D): AB POS

## 2013-09-21 LAB — HIV ANTIBODY (ROUTINE TESTING W REFLEX): HIV 1&2 Ab, 4th Generation: NONREACTIVE

## 2013-09-21 MED ORDER — NICOTINE 7 MG/24HR TD PT24
7.0000 mg | MEDICATED_PATCH | Freq: Every day | TRANSDERMAL | Status: DC
  Administered 2013-09-21 – 2013-10-03 (×12): 7 mg via TRANSDERMAL
  Filled 2013-09-21 (×13): qty 1

## 2013-09-21 MED ORDER — PENICILLIN G POTASSIUM 5000000 UNITS IJ SOLR: 2.5000 10*6.[IU] | INTRAVENOUS | Status: DC

## 2013-09-21 MED ORDER — DOCUSATE SODIUM 100 MG PO CAPS
100.0000 mg | ORAL_CAPSULE | Freq: Two times a day (BID) | ORAL | Status: DC | PRN
  Administered 2013-09-21 – 2013-09-28 (×7): 100 mg via ORAL
  Filled 2013-09-21 (×7): qty 1

## 2013-09-21 MED ORDER — STERILE WATER FOR INJECTION IJ SOLN: 0.4000 mL | Freq: Once | INTRAMUSCULAR | Status: DC

## 2013-09-21 MED ORDER — BETAMETHASONE SOD PHOS & ACET 6 (3-3) MG/ML IJ SUSP
12.0000 mg | Freq: Once | INTRAMUSCULAR | Status: AC
  Administered 2013-09-21: 12 mg via INTRAMUSCULAR
  Filled 2013-09-21: qty 2

## 2013-09-21 MED ORDER — BETAMETHASONE SOD PHOS & ACET 6 (3-3) MG/ML IJ SUSP: 12.0000 mg | Freq: Once | INTRAMUSCULAR | Status: DC

## 2013-09-21 MED ORDER — PENICILLIN G POTASSIUM 5000000 UNITS IJ SOLR: 5.0000 10*6.[IU] | Freq: Once | INTRAMUSCULAR | Status: DC

## 2013-09-21 MED ORDER — PENICILLIN G POTASSIUM 5000000 UNITS IJ SOLR
5.0000 10*6.[IU] | Freq: Once | INTRAVENOUS | Status: AC
  Administered 2013-09-21: 5 10*6.[IU] via INTRAVENOUS
  Filled 2013-09-21: qty 5

## 2013-09-21 MED ORDER — PENICILLIN G POTASSIUM 5000000 UNITS IJ SOLR
2.5000 10*6.[IU] | INTRAVENOUS | Status: DC
  Administered 2013-09-21 – 2013-09-27 (×33): 2.5 10*6.[IU] via INTRAVENOUS
  Filled 2013-09-21 (×36): qty 2.5

## 2013-09-21 NOTE — Progress Notes (Signed)
Contractions have slowed down considerably, now about every 9-15 minutes.  FHRs are difficult to trace d/t gestational age, but they are both reassuring.  Dr. Eric FormWimmer in to talk with pt.

## 2013-09-21 NOTE — Consult Note (Addendum)
Asked by Dr.Dove to provide prenatal consultation for 23 yo G3 P 0 mother at 6024 wks EGA with twins who was brought to [facility] by EMS after she had onset contractions.  Bulging membranes and advanced cervical dilatation noted on OB's exam, but contractions slowed after she was treated with Mg, terubtaline, and indomethacin and she is being treated with betamethasone and ampicillin. Twin A vertex, twin B breech (per ultrasound).  Discussed usual expectations for preterm infants at [redacted] weeks gestation, including possible needs for DR resuscitation, respiratory support, IV access, and blood products.  Also presented risks of death or serious morbidity, including lung damage and neurodevelopmental delays.  Projected possible length of stay in NICU until 36 - [redacted] wks EGA.  Discussed advantages of feeding with mother's milk.  She plans to pump postnatally.  Patient was attentive, concerned, had appropriate questions, and was appreciative of my input.  She expressed desire to delay delivery as long as possible.  Thank you for the consultation.  Total time 20 minutes (all face-to-face)  Add: subsequently learned twin B has 2 vessel cord and echogenicintracardiac focus

## 2013-09-21 NOTE — Progress Notes (Signed)
CNM and resident at bedside with bedside US--fequent fetal movement noted--difficult to keep fetus on continuous fetal monitoring--good heart rate visualized--continue to try and monitor FHR

## 2013-09-21 NOTE — Progress Notes (Signed)
Pt reports mild irregular contractions from 15-3460mins

## 2013-09-21 NOTE — Progress Notes (Signed)
Attending in department--aware of difficultly with FHR tracing--provider ok with intermittent tracing-- reports no concerns of fetal wellbeing

## 2013-09-21 NOTE — Progress Notes (Signed)
   Erby Pianngel V Acocella is a 23 y.o. G3P0020 at 6118w1d  admitted for active labor  Subjective:  contractions have spaced out considerably, now reportedly q 15-60 minutes  Objective: Filed Vitals:   09/21/13 0701 09/21/13 0731 09/21/13 0801 09/21/13 0831  BP: 112/60 112/54 114/61 116/87  Pulse: 95 99 104 107  Temp:   98 F (36.7 C)   TempSrc:   Oral   Resp: 20 16 16 16   Height:      Weight:      SpO2:          FHT:  FHR: 140 bpm, variability: moderate,  accelerations:  Abscent,  decelerations:  Absent  Still difficult to trace continuously, but reassuring for GA UC:   rare SVE:    deferred   Labs: Lab Results  Component Value Date   WBC 25.9* 09/21/2013   HGB 9.3* 09/21/2013   HCT 27.0* 09/21/2013   MCV 93.4 09/21/2013   PLT 252 09/21/2013    Assessment / Plan: PTL, arrested Will give 2nd dose of BMZ at 12 hours Continue tocolytics/trendelenburg Add stool softner SW consult d/t social issues  Labor: no Fetal Wellbeing:  Category I Pain Control:  Labor support without medications Anticipated MOD:  SVD  CRESENZO-DISHMAN,Demica Zook 09/21/2013, 9:01 AM

## 2013-09-21 NOTE — Progress Notes (Signed)
Rate not increased after discussing with K. Clelia CroftShaw CNM

## 2013-09-21 NOTE — Progress Notes (Signed)
Magneisum changed to 4gm per order- was currently at 2gm

## 2013-09-22 LAB — RPR

## 2013-09-22 NOTE — Progress Notes (Signed)
RN requested CSW intervention for support.  Patient is [redacted] weeks pregnant with twins and concerned about being evicted from their home.  Met with patient at length.  Attempted to have her refocus her thoughts on the babies instead of her situation.  She is very preoccupied with what is going on with her boyfriend, losing her stuff in the apartment, boyfriend's father who started living with them about 2 months ago, and how she looks in the hospital.  Her mood was slightly elevated and her speech was pressured at times.   Discussed her options if evicted.  Patient states that she can always depend on her mother, although she didn't want to.  Although she was never told by this Probation officer that she was being investigation.  She mentioned several times during the conversation her concern with being investigated, and her babies being taken away from her.  Reiterated this writer's role several times.  She was vague about "being investigated".  She admits to hx of marijuana, but was very vague and did seem to want to talk about it.  It was mentioned in the chart that patient called EMS to bring her to the hospital because "all the family was under the influence".  Questioned mother about this, and she said it was a misunderstanding.  Patient became tearful several times during the conversation and repeatedly encouraged to focus on the pregnancy instead of all the stressors in her life.  She would quickly change topics and become overwhelmed.  Discussed techniques used in the past to keep her calm, and lower her anxiety.   Encouraged her to use those techniques.  She reports hx of bipolar during teenage years, and states that she was on medication but they didn't work.  She also reports hx of psychiatric hospitalization when she was a teenager.  Provided supportive feedback and extensive support.  Informed her of CSW availability.

## 2013-09-22 NOTE — Progress Notes (Signed)
Talked with Gerlene Burdockichard of [employer] and [employer] after the pt confided to this RN that she is to be evicted in 2 days, has no support systems other than her FOB and they are currently fighting, has no car and no job and [place] and is having trouble reseting her current court dates.

## 2013-09-22 NOTE — Progress Notes (Addendum)
Continue to adjust monitors.  Good fetal movement audible.

## 2013-09-23 NOTE — Progress Notes (Signed)
Pt states she seems to be feeling more contractions that are painful. Dr Despina Hidden made aware and states we are doing everything at this time to stop contractions. Pt made aware. Will continue to monitor and follow closely.

## 2013-09-23 NOTE — Progress Notes (Signed)
At bedside tracing babies good fetal movement audible. Also, mom noted to be moaning thru some contractions. States that she is starting to feel contractions more frequently since 1800 but are short in duration.  Feels more pain in rectal and vaginal areas but is not stronger than before.  Hilda Lias, CNM made aware.  No change in POC at this time.  Pt made aware. Will continue to monitor closely.

## 2013-09-23 NOTE — Progress Notes (Signed)
RN has been at bedside continuously trying to obtain NST for >1 hour

## 2013-09-24 LAB — GC/CHLAMYDIA PROBE AMP
CT PROBE, AMP APTIMA: NEGATIVE
GC Probe RNA: NEGATIVE

## 2013-09-24 MED ORDER — TERBUTALINE SULFATE 1 MG/ML IJ SOLN
0.2500 mg | Freq: Once | INTRAMUSCULAR | Status: AC
  Administered 2013-09-24: 0.25 mg via SUBCUTANEOUS

## 2013-09-24 MED ORDER — INDOMETHACIN 50 MG PO CAPS
100.0000 mg | ORAL_CAPSULE | Freq: Once | ORAL | Status: AC
  Administered 2013-09-24: 100 mg via ORAL
  Filled 2013-09-24: qty 2

## 2013-09-24 MED ORDER — INDOMETHACIN 25 MG PO CAPS
25.0000 mg | ORAL_CAPSULE | ORAL | Status: AC
  Administered 2013-09-24 – 2013-09-27 (×18): 25 mg via ORAL
  Filled 2013-09-24 (×18): qty 1

## 2013-09-24 MED ORDER — NIFEDIPINE ER 30 MG PO TB24
30.0000 mg | ORAL_TABLET | Freq: Two times a day (BID) | ORAL | Status: DC
  Administered 2013-09-24 – 2013-09-30 (×13): 30 mg via ORAL
  Filled 2013-09-24 (×16): qty 1

## 2013-09-24 NOTE — Progress Notes (Signed)
Care transferred from Jobe Marker, RN to Woody Seller, RN

## 2013-09-24 NOTE — Progress Notes (Addendum)
Patient ID: Aimee Santiago, female   DOB: 08/20/1990, 23 y.o.   MRN: [id number] Patient found eating breakfast. She reports persistent contractions unchanged since admission  TOCO: ctx q-6-7 minutes VE: bulging bag at +2 station. Did not attempt to feel for cervical dilation or vertex station  A/P 23 yo with TIUP at 24.4 with preterm labor - Case discussed with MFM Particia Nearing)- will discontinue Magnesium sulfate - Will continue procardia for tocolysis - Continue current care and close monitoring

## 2013-09-24 NOTE — Progress Notes (Signed)
Patient ID: Aimee Santiago, female   DOB: 02/05/1990, 23 y.o.   MRN: [id number] Doing OK  States feels contractions a "little more than before, but not a lot more"  Filed Vitals:   09/23/13 2001 09/23/13 2102 09/23/13 2201 09/24/13 0000  BP: 103/53 100/77 119/75   Pulse: 104 101 110   Temp:  98.7 F (37.1 C)    TempSrc:  Axillary    Resp: Height:      Weight:      SpO2:       FHR tracings reviewed from 8-9pm Both twins reassuring with good variability and even some accels  UCs have been around every 6-8 minutes  States feels no rectal pressure  Will continue to observe. Dr Despina Hidden aware of continued contractions Will avoid unnecessary vaginal exams

## 2013-09-24 NOTE — Progress Notes (Signed)
Patient ID: Aimee Santiago, female   DOB: 15-Sep-1990, 23 y.o.   MRN: [id number] Doing OK but states UCs hurt some more  Had a large BM this morning. Uncomfortable with urination on bedpan  UCs every 5-7 minutes  Discussed with Dr Despina Hidden  Will try a dose of Terbutaline

## 2013-09-24 NOTE — Progress Notes (Signed)
I visited with Aimee Santiago and provided refelctive listening for over an hour as she relayed some important life events leading up to this hospitalization.  She is going through a very difficult time financially and she is currently in the process of moving. Her boyfriend (they've been together for 7 years) is trying to take care of the details of the move now that she is in the hospital.  Her mother has been very supportive during this time, but they have had relationship difficulties in the past.  Aimee Santiago appears to be in a period of shock and has some anxiety about being here with her babies so early.  She is asking theological questions about why this is happening and trying to make sense of her story.  She feels that her babies have saved her life and she did state that before she found out she was pregnant, she had suicidal ideation.  She reports that since the pregnancy, she has had no thought of that because her babies have given her a reason to live.  She spoke very openly with me both with her mother in the room and then alone for a short period when her mother stepped out to give [place] some privacy.  Her mother requested prayer and she was grateful for the prayer.  We will continue to follow up with her, but please also page as needs arise.  [employer] Pager, [phone] 4:05 PM   09/24/13 1500  Clinical Encounter Type  Visited With Patient;Patient and family together  Visit Type Spiritual support  Referral From (Safety Rounds)  Spiritual Encounters  Spiritual Needs Emotional;Prayer  Stress Factors  Patient Stress Factors Loss of control;Financial concerns;Major life changes;Family relationships

## 2013-09-25 ENCOUNTER — Encounter (HOSPITAL_COMMUNITY): Payer: Self-pay | Admitting: *Deleted

## 2013-09-25 ENCOUNTER — Inpatient Hospital Stay (HOSPITAL_COMMUNITY): Payer: Medicaid Other

## 2013-09-25 DIAGNOSIS — O4700 False labor before 37 completed weeks of gestation, unspecified trimester: Secondary | ICD-10-CM

## 2013-09-25 DIAGNOSIS — O30009 Twin pregnancy, unspecified number of placenta and unspecified number of amniotic sacs, unspecified trimester: Secondary | ICD-10-CM

## 2013-09-25 NOTE — Progress Notes (Signed)
Patient ID: Aimee Santiago, female   DOB: 31-Oct-1990, 23 y.o.   MRN: [id number] Patient is comfortable this morning. She reports spaced out contractions since initiation of Procardia. No other concerning symptoms.  FHR for A&B early this morning: 145 bpm, minimal variability, small accelerations, variable decelerations TOCO: ctx 1-3/per hour VE: bulging bag at +2 station as per last exam by Dr. Jolayne Panther  A/P 23 yo with  Di/di twin IUP at [redacted]w[redacted]d with preterm labor - Will continue Indocin and Procardia for tocolysis - Continue current care and close monitoring. FHR reassuring and appropriate for GA. - Vertex/Breech presentation; vaginal delivery anticipated in the event of progressing PTL   Aimee Collins, MD, FACOG Attending Obstetrician & Gynecologist Faculty Practice, [facility]

## 2013-09-25 NOTE — Progress Notes (Signed)
Pt's mother asking if it is possible to get vouchers or other assistance for motel stay for father of baby or for herself due to lengthy hospital stay for patient & expected lengthy NICU stay for babies. Called hospital social worker and left message pertaining to above.

## 2013-09-26 NOTE — Progress Notes (Addendum)
   CAMMI CONSALVO is a 23 y.o. G3P0020 at [redacted]w[redacted]d  admitted for active labor with intact membranes  Subjective: Eating well. No pain, contractions q 10 minutes per pt. No LOF. Some pink/red discharge overnight.   Objective: Filed Vitals:   09/26/13 0058 09/26/13 0459 09/26/13 0907 09/26/13 1216  BP: 101/56 126/74 122/69   Pulse: 99 109 100   Temp: 98.4 F (36.9 C) 98.6 F (37 C) 98.6 F (37 C) 97.8 F (36.6 C)  TempSrc: Oral Oral  Oral  Resp: Height:      Weight:      SpO2:          FHT: A: 155, mod variability, no accels (appropriately), no decels B: 155, mod variability, no accels (appropriately), no decels UC:   regular, every 10 minutes SVE:   Dilation:  (Undeterminable) Exam by:: Dr Jolayne Panther  Labs: Lab Results  Component Value Date   WBC 25.9* 09/21/2013   HGB 9.3* 09/21/2013   HCT 27.0* 09/21/2013   MCV 93.4 09/21/2013   PLT 252 09/21/2013    Assessment / Plan: BRYTNEE BECHLER is a 23 y.o. G3P0020 at [redacted]w[redacted]d presenting with SOL fully dilated from previous exams. Membranes intact   Labor: Continue on Indocin, procardia Fetal Wellbeing:  Cat 1 ID: PCN for GBS positive Joanna Puff 09/26/2013, 2:20 PM     I have seen this patient and agree with the above resident's note.  Reviewed plan of care with Dr Debroah Loop.  Plan for pt to remain on Birthing Suites at this time.    LEFTWICH-KIRBY, LISA Certified Nurse-Midwife

## 2013-09-26 NOTE — Progress Notes (Signed)
Attempted follow-up visit with Aimee Santiago, but she was not able to speak at the time.  We will continue to check in on her, but please also page as needs arise.  [address] [place] Pager, [phone] 4:14 PM   09/26/13 1600  Clinical Encounter Type  Visited With Patient  Visit Type Follow-up

## 2013-09-26 NOTE — Progress Notes (Signed)
Bedbath given.  Washed hair.  Pt states "I feel so much better>"

## 2013-09-27 ENCOUNTER — Inpatient Hospital Stay (HOSPITAL_COMMUNITY): Payer: Medicaid Other

## 2013-09-27 DIAGNOSIS — O4290 Premature rupture of membranes, unspecified as to length of time between rupture and onset of labor, unspecified weeks of gestation: Secondary | ICD-10-CM

## 2013-09-27 DIAGNOSIS — O09899 Supervision of other high risk pregnancies, unspecified trimester: Secondary | ICD-10-CM

## 2013-09-27 LAB — TYPE AND SCREEN
ABO/RH(D): AB POS
ABO/RH(D): AB POS
ANTIBODY SCREEN: NEGATIVE
Antibody Screen: NEGATIVE

## 2013-09-27 LAB — AMNISURE RUPTURE OF MEMBRANE (ROM) NOT AT ARMC: Amnisure ROM: POSITIVE

## 2013-09-27 MED ORDER — INDOMETHACIN 25 MG PO CAPS
25.0000 mg | ORAL_CAPSULE | ORAL | Status: DC
  Administered 2013-09-27 – 2013-09-30 (×18): 25 mg via ORAL
  Filled 2013-09-27 (×24): qty 1

## 2013-09-27 MED ORDER — SODIUM CHLORIDE 0.9 % IV SOLN
2.0000 g | Freq: Four times a day (QID) | INTRAVENOUS | Status: AC
  Administered 2013-09-27 – 2013-09-29 (×8): 2 g via INTRAVENOUS
  Filled 2013-09-27 (×9): qty 2000

## 2013-09-27 MED ORDER — SODIUM CHLORIDE 0.9 % IV SOLN
250.0000 mg | Freq: Four times a day (QID) | INTRAVENOUS | Status: AC
  Administered 2013-09-27 – 2013-09-29 (×8): 250 mg via INTRAVENOUS
  Filled 2013-09-27 (×8): qty 5

## 2013-09-27 NOTE — Progress Notes (Signed)
CNM reviewing FHR strip

## 2013-09-27 NOTE — Progress Notes (Signed)
Patient ID: [name], female   DOB: 1990/09/23, 23 y.o.   MRN: [id number]  Reviewed case with Dr. Sherrie George MFM aw well as [place] images.  On [place] there does not appear to be any fluid around the baby's head which is in the vagina.  Last cervical exam was bulging membranes.  Discussed the possibility of the head being through the cervix with the cervix contracted near the neck.  Images are not able to show this area well.  It was decided to do vaginal exam and try to feel for cervix and it's dilation.  This was reviewed with the patient who understands the need for the exam.    On exam, there are no membranes felt and head is in vagina.  The cervix feels to be adequately dilated as not not cause obstruction if emergency c/s was necessary.  Pt feels to be ruptured.  Pt is having small amount of vaginal bleeding which will make amniosure inaccurate.  Will treat was PPROM and start antibiotics.  Will also continue indocin as this is the tocolytic she responds to best.  Will monitor white count and abdominal tenderness for signs of chorio.  Indocin can mask a fever so will will be vigilant in monitoring for other signs of chorio.

## 2013-09-27 NOTE — Progress Notes (Signed)
09/27/13 1300  Clinical Encounter Type  Visited With Patient and family together ([place] A.J.)  Visit Type Follow-up;Spiritual support;Social support  Referral From Chaplain (Dyanne Carrel, MSM)  Spiritual Encounters  Spiritual Needs Emotional  Stress Factors  Patient Stress Factors Loss of control;Major life changes;Financial concerns   Visited with Aimee Santiago and Aimee Santiago for nearly two hours this morning/afternoon, as she shared and processed about Facilities manager" and concerns.  Aimee Santiago is motivated by education, which is a significant part of her self-understanding and sense of family identity.  Per pt, finances, legal matters, education, and the excitement/challenge of caring for twins are all stressors.  Served as witness to her story, reflection, and meaning-making, providing pastoral presence, empathic listening, and spiritual companionship.  Aimee Santiago will follow for spiritual/emotional support, but please also page as needs arise.  Thank you.  [address] [place], [place] [phone]

## 2013-09-28 DIAGNOSIS — O30039 Twin pregnancy, monochorionic/diamniotic, unspecified trimester: Secondary | ICD-10-CM

## 2013-09-28 DIAGNOSIS — O30009 Twin pregnancy, unspecified number of placenta and unspecified number of amniotic sacs, unspecified trimester: Secondary | ICD-10-CM

## 2013-09-28 LAB — COMPREHENSIVE METABOLIC PANEL
ALBUMIN: 2.3 g/dL — AB (ref 3.5–5.2)
ALK PHOS: 103 U/L (ref 39–117)
ALT: 8 U/L (ref 0–35)
ANION GAP: 11 (ref 5–15)
AST: 8 U/L (ref 0–37)
BUN: 9 mg/dL (ref 6–23)
CHLORIDE: 100 meq/L (ref 96–112)
CO2: 25 mEq/L (ref 19–32)
Calcium: 8.3 mg/dL — ABNORMAL LOW (ref 8.4–10.5)
Creatinine, Ser: 0.46 mg/dL — ABNORMAL LOW (ref 0.50–1.10)
GFR calc Af Amer: >90 mL/min (ref >90)
GFR calc non Af Amer: >90 mL/min (ref >90)
Glucose, Bld: 83 mg/dL (ref 70–99)
Potassium: 3.9 mEq/L (ref 3.7–5.3)
SODIUM: 136 meq/L — AB (ref 137–147)
TOTAL PROTEIN: 5.3 g/dL — AB (ref 6.0–8.3)
Total Bilirubin: <0.2 mg/dL — ABNORMAL LOW (ref 0.3–1.2)

## 2013-09-28 LAB — CBC
HCT: 27.1 % — ABNORMAL LOW (ref 36.0–46.0)
Hemoglobin: 9.1 g/dL — ABNORMAL LOW (ref 12.0–15.0)
MCH: 31.4 pg (ref 26.0–34.0)
MCHC: 33.6 g/dL (ref 30.0–36.0)
MCV: 93.4 fL (ref 78.0–100.0)
PLATELETS: 276 10*3/uL (ref 150–400)
RBC: 2.9 MIL/uL — ABNORMAL LOW (ref 3.87–5.11)
RDW: 12.9 % (ref 11.5–15.5)
WBC: 19.1 10*3/uL — ABNORMAL HIGH (ref 4.0–10.5)

## 2013-09-28 NOTE — Progress Notes (Signed)
Attending Progress Note  Aimee Santiago is a 23 y.o. G3P0020 at [redacted]w[redacted]d  with di/di twins admitted for active labor; diagnosed with PPROM on 09/27/13.  Subjective: Eating well. No pain, contractions irregular per pt. No LOF. Some pink/red discharge overnight.   Objective: Filed Vitals:   09/27/13 1152 09/27/13 1547 09/27/13 2006 09/28/13 0610  BP: 125/84 121/71 125/76   Pulse: 103 94 94   Temp: 97.8 F (36.6 C) 97.9 F (36.6 C) 98.4 F (36.9 C) 98.2 F (36.8 C)  TempSrc: Oral Oral Oral Oral  Resp: Height:      Weight:      SpO2:        FHT: A: 145, mod variability, no accels (appropriately), no decels B: 145, mod variability, no accels (appropriately), no decels UC:   irregular, every 10 minutes SVE:   Dilation:  (Undeterminable) Exam by:: Dr. Penne Lash  Labs: Lab Results  Component Value Date   WBC 19.1* 09/28/2013   HGB 9.1* 09/28/2013   HCT 27.1* 09/28/2013   MCV 93.4 09/28/2013   PLT 276 09/28/2013    Assessment / Plan: PPROM at [redacted]w[redacted]d with di/di twins Continue latency antibiotics Will consult MFM today about continuing/discontinuing tocolyitcs ([place] and Indocin) given PPROM Continue inpatient close observation   [facility] A, MD 09/28/2013, 7:18 AM

## 2013-09-28 NOTE — Progress Notes (Signed)
NICU Note:  Spoke with mother regarding questions related to prematurity and NICU stay.  She has spoken with Dr. Eric Form on 8/21 but wanted some clarification regarding survival statistics, length of stay, feedings etc.  John Giovanni, DO  Neonatologist

## 2013-09-28 NOTE — Progress Notes (Signed)
Follow up Spiritual Care Visit  Aimee Santiago has many stressers in her life at present. She is a person who feels the need to be productive and accomplish more and more. Being on bed rest is its own stresser, but she balances this by remembering she is doing this for her twins. She reports she has not been an active member of a community of faith or any other spiritual body, but is a spiritual person who finds meaning and direction in the [place] faith. She is active in her prayer life, trusting the divine presence to assist her. Her SO is in the process of moving them into a new place, and this makes her anxious because she feels she needs to help. Her academic dreams are currently on hold and she wishes to return to course by course finish her BA or BS degree.  Discussion centered on setting aside those things that stress her that she cannot currently do anything about, instead concentrating of staying pregnant as long as she can to give her twins the best chances in life. Also to dream dreams beyond the near future when she can surround herself with things of peace and joy. She is open to staff assisting her in being in a balanced state physically, mentally and spiritually to best meet the needs of her unborn children and her.  Chaplain guiding Aimee Lopata in prayer and in mediation to assist her in lowering her stress levels. Initial observation indicates these were successful.  Aimee Finck reports that the care given her has been exceptional and she has words of praise for the nursing staff for their compassionate care. Before being admitted she was unaware that the staff included chaplains who would assist her in her spiritual needs. Now that she is aware of this fact, she has been encouraged to have a chaplain paged when she is in need of their care.   Please page the chaplain should Aimee Westrich require care at any time.   Benjie Karvonen. Hutchinson Isenberg, DMin Chaplain

## 2013-09-28 NOTE — Progress Notes (Signed)
09/28/13 1700  Clinical Encounter Type  Visited With Patient  Visit Type Follow-up   Followed up briefly to continue building rapport.  Per pt, she had made many calls this afternoon taking care of logistics and admin needs, which helped her feel productive, and she received emails of encouragement from friends, which helped her become more centered and grounded in the midst of her stress.  [place] will continue to follow, but please also page as needs arise.  Thank you.  [address] [place], [place] [phone]

## 2013-09-28 NOTE — Progress Notes (Signed)
Patient ID: Aimee Santiago, female   DOB: 10-May-1990, 23 y.o.   MRN: [id number] [facility]ULT[facility] PRACTICE ANTEPARTUM COMPREHENSIVE PROGRESS NOTE  Aimee Santiago is a 23 y.o. G3P0020 at [redacted]w[redacted]d  who is admitted for advanced cervical dilation in twin IUP mono/di   Fetal presentation is vertex/breech. Length of Stay:  8  Days  Subjective: Pt denies abd pain or ctx.  She reports mucous discharge but nothing more. She denies bleeding or spotting currently.  She reports +FM.  Denies fever or chills.    Vitals:  Blood pressure 122/74, pulse 112, temperature 98.6 F (37 C), temperature source Oral, resp. rate 18, height  (1.549 m), weight 140 lb (63.504 kg), last menstrual period 04/05/2013, SpO2 97.00%. Physical Examination: General appearance - alert, well appearing, and in no distress Abdomen - soft, nontender, nondistended, no masses or organomegaly gravid Extremities - peripheral pulses normal, no pedal edema, no clubbing or cyanosis Cervical Exam: Not evaluated. A   Fetal Monitoring:  Toco with occ ctx.  FHR x2 Cat 1  Labs:  Results for orders placed during the hospital encounter of 09/20/13 (from the past 24 hour(s))  CBC   Collection Time    09/28/13  5:30 AM      Result Value Ref Range   WBC 19.1 (*) 4.0 - 10.5 K/uL   RBC 2.90 (*) 3.87 - 5.11 MIL/uL   Hemoglobin 9.1 (*) 12.0 - 15.0 g/dL   HCT 69.6 (*) 29.5 - 28.4 %   MCV 93.4  78.0 - 100.0 fL   MCH 31.4  26.0 - 34.0 pg   MCHC 33.6  30.0 - 36.0 g/dL   RDW 13.2  44.0 - 10.2 %   Platelets 276  150 - 400 K/uL  COMPREHENSIVE METABOLIC PANEL   Collection Time    09/28/13  5:30 AM      Result Value Ref Range   Sodium 136 (*) 137 - 147 mEq/L   Potassium 3.9  3.7 - 5.3 mEq/L   Chloride 100  96 - 112 mEq/L   CO2 25  19 - 32 mEq/L   Glucose, Bld 83  70 - 99 mg/dL   BUN 9  6 - 23 mg/dL   Creatinine, Ser 7.25 (*) 0.50 - 1.10 mg/dL   Calcium 8.3 (*) 8.4 - 10.5 mg/dL   Total Protein 5.3 (*) 6.0 - 8.3 g/dL   Albumin 2.3 (*) 3.5 - 5.2 g/dL    AST 8  0 - 37 U/L   ALT 8  0 - 35 U/L   Alkaline Phosphatase 103  39 - 117 U/L   Total Bilirubin <0.2 (*) 0.3 - 1.2 mg/dL   GFR calc non Af Amer >90  >90 mL/min   GFR calc Af Amer >90  >90 mL/min   Anion gap 11  5 - 15    Imaging Studies:    See sono report 09/25/2013   Medications:  Scheduled . ampicillin (OMNIPEN) IV  2 g Intravenous 4 times per day  . erythromycin  250 mg Intravenous Q6H  . indomethacin  25 mg Oral Q4H  . magnesium  6 g Intravenous Once  . nicotine  7 mg Transdermal Daily  . NIFEdipine  30 mg Oral BID  . terbutaline  0.25 mg Subcutaneous Once   I have reviewed the patient's current medications.  ASSESSMENT: Patient Active Problem List   Diagnosis Date Noted  . Preterm labor 09/20/2013  . Supervision of other high-risk pregnancy(V23.89) 07/24/2013  . Twins 07/24/2013  PLAN: Will continue current plan.  Pt restarted on indocin 8/27.  Will continue for now Recheck for maternal or fetal indications Deliver for maternal or fetal indication  Continue routine antenatal care on L&D.   Santiago, Aimee Reth 09/28/2013,1:31 PM

## 2013-09-29 LAB — AMNISURE RUPTURE OF MEMBRANE (ROM) NOT AT ARMC: Amnisure ROM: POSITIVE

## 2013-09-29 MED ORDER — AZITHROMYCIN 250 MG PO TABS
500.0000 mg | ORAL_TABLET | Freq: Every day | ORAL | Status: DC
  Administered 2013-09-29 – 2013-09-30 (×2): 500 mg via ORAL
  Filled 2013-09-29: qty 1
  Filled 2013-09-29 (×2): qty 2

## 2013-09-29 MED ORDER — AMOXICILLIN 500 MG PO CAPS
500.0000 mg | ORAL_CAPSULE | Freq: Three times a day (TID) | ORAL | Status: DC
  Administered 2013-09-29 – 2013-09-30 (×3): 500 mg via ORAL
  Filled 2013-09-29 (×7): qty 1

## 2013-09-29 NOTE — Progress Notes (Addendum)
Patient ID: Aimee Santiago, female   DOB: 07-03-1990, 23 y.o.   MRN: [id number] ACULTY PRACTICE ANTEPARTUM COMPREHENSIVE PROGRESS NOTE  Aimee Santiago is a 23 y.o. G3P0020 at [redacted]w[redacted]d  who is admitted for advance cervical dilation now with PROM.   Fetal presentation is vertex/breech. Length of Stay:  9  Days  Subjective: Pt reports that since this am she has been having LOF in small amounts.   Patient reports good fetal movement.  She reports intermittent mild uterine contractions but, no bleeding.     Vitals:  Blood pressure 112/67, pulse 87, temperature 98.1 F (36.7 C), temperature source Oral, resp. rate 20, height  (1.549 m), weight 140 lb (63.504 kg), last menstrual period 04/05/2013, SpO2 97.00%. Physical Examination: General appearance - alert, well appearing, and in no distress Abdomen - soft, nontender, nondistended, no masses or organomegaly gravid Extremities - peripheral pulses normal, no pedal edema, no clubbing or cyanosis Cervical Exam: Not evaluated.  Membranes:ruptured, Amnisure repeated today as the previous one was done in the face of presumed bleeding.  Still positive  Fetal Monitoring:  Baseline: 130's bpm and Variability: Good {> 6 bpm)  Labs:  Results for orders placed during the hospital encounter of 09/20/13 (from the past 24 hour(s))  AMNISURE RUPTURE OF MEMBRANE (ROM)   Collection Time    09/29/13  8:05 AM      Result Value Ref Range   Amnisure ROM POSITIVE      Imaging Studies:    None today  Medications:  Scheduled . amoxicillin  500 mg Oral 3 times per day  . azithromycin  500 mg Oral Daily  . indomethacin  25 mg Oral Q4H  . magnesium  6 g Intravenous Once  . nicotine  7 mg Transdermal Daily  . NIFEdipine  30 mg Oral BID  . terbutaline  0.25 mg Subcutaneous Once   I have reviewed the patient's current medications.  ASSESSMENT: Patient Active Problem List   Diagnosis Date Noted  . Preterm labor 09/20/2013  . Supervision of other  high-risk pregnancy(V23.89) 07/24/2013  . Twins 07/24/2013    PLAN: Fetal monitoring changed to q 4 hours NST with continuous toco Pt on day #2 of Indocin S/p repeat NICU consult last evening Continue latency atbx Day #3/7 Deliver for maternal or fetal indications watch for s/sx of impending infection   Continue routine antenatal care.   HARRAWAY-SMITH, Haidyn Chadderdon 09/29/2013,9:26 AM

## 2013-09-30 ENCOUNTER — Inpatient Hospital Stay (HOSPITAL_COMMUNITY): Payer: Medicaid Other | Admitting: Anesthesiology

## 2013-09-30 ENCOUNTER — Encounter (HOSPITAL_COMMUNITY): Payer: Medicaid Other | Admitting: Anesthesiology

## 2013-09-30 ENCOUNTER — Encounter (HOSPITAL_COMMUNITY)
Admission: AD | Disposition: A | Discharge status: Home / Self Care | Payer: Self-pay | Source: Ambulatory Visit | Attending: Obstetrics & Gynecology

## 2013-09-30 ENCOUNTER — Encounter (HOSPITAL_COMMUNITY): Payer: Self-pay | Admitting: *Deleted

## 2013-09-30 DIAGNOSIS — O4290 Premature rupture of membranes, unspecified as to length of time between rupture and onset of labor, unspecified weeks of gestation: Secondary | ICD-10-CM

## 2013-09-30 DIAGNOSIS — O99892 Other specified diseases and conditions complicating childbirth: Secondary | ICD-10-CM

## 2013-09-30 DIAGNOSIS — O30009 Twin pregnancy, unspecified number of placenta and unspecified number of amniotic sacs, unspecified trimester: Secondary | ICD-10-CM

## 2013-09-30 DIAGNOSIS — O9989 Other specified diseases and conditions complicating pregnancy, childbirth and the puerperium: Secondary | ICD-10-CM

## 2013-09-30 LAB — PREPARE RBC (CROSSMATCH)

## 2013-09-30 LAB — CBC
HEMATOCRIT: 25.5 % — AB (ref 36.0–46.0)
HEMOGLOBIN: 8.6 g/dL — AB (ref 12.0–15.0)
MCH: 31.6 pg (ref 26.0–34.0)
MCHC: 33.7 g/dL (ref 30.0–36.0)
MCV: 93.8 fL (ref 78.0–100.0)
Platelets: 255 10*3/uL (ref 150–400)
RBC: 2.72 MIL/uL — AB (ref 3.87–5.11)
RDW: 12.8 % (ref 11.5–15.5)
WBC: 16.7 10*3/uL — ABNORMAL HIGH (ref 4.0–10.5)

## 2013-09-30 SURGERY — Surgical Case
Anesthesia: Spinal

## 2013-09-30 MED ORDER — LANOLIN HYDROUS EX OINT: 1.0000 "application " | TOPICAL_OINTMENT | CUTANEOUS | Status: DC | PRN

## 2013-09-30 MED ORDER — SCOPOLAMINE 1 MG/3DAYS TD PT72
1.0000 | MEDICATED_PATCH | Freq: Once | TRANSDERMAL | Status: DC
  Administered 2013-09-30: 1.5 mg via TRANSDERMAL

## 2013-09-30 MED ORDER — METOCLOPRAMIDE HCL 5 MG/ML IJ SOLN: 10.0000 mg | Freq: Three times a day (TID) | INTRAMUSCULAR | Status: DC | PRN

## 2013-09-30 MED ORDER — PHENYLEPHRINE 8 MG IN D5W 100 ML (0.08MG/ML) PREMIX OPTIME
INJECTION | INTRAVENOUS | Status: AC
  Filled 2013-09-30: qty 100

## 2013-09-30 MED ORDER — CEFAZOLIN SODIUM-DEXTROSE 2-3 GM-% IV SOLR
INTRAVENOUS | Status: DC | PRN
  Administered 2013-09-30: 2 g via INTRAVENOUS

## 2013-09-30 MED ORDER — HYDROMORPHONE HCL PF 1 MG/ML IJ SOLN
INTRAMUSCULAR | Status: AC
  Administered 2013-09-30: 0.5 mg via INTRAVENOUS
  Filled 2013-09-30: qty 1

## 2013-09-30 MED ORDER — 0.9 % SODIUM CHLORIDE (POUR BTL) OPTIME
TOPICAL | Status: DC | PRN
  Administered 2013-09-30: 1000 mL

## 2013-09-30 MED ORDER — OXYTOCIN 10 UNIT/ML IJ SOLN
INTRAMUSCULAR | Status: AC
Start: 2013-09-30 — End: 2013-09-30
  Filled 2013-09-30: qty 4

## 2013-09-30 MED ORDER — FENTANYL CITRATE 0.05 MG/ML IJ SOLN
INTRAMUSCULAR | Status: DC | PRN
  Administered 2013-09-30: 15 ug via INTRATHECAL

## 2013-09-30 MED ORDER — SODIUM CHLORIDE 0.9 % IV SOLN
1.0000 g | INTRAVENOUS | Status: DC
  Filled 2013-09-30 (×3): qty 1000

## 2013-09-30 MED ORDER — DIPHENHYDRAMINE HCL 50 MG/ML IJ SOLN: 25.0000 mg | INTRAMUSCULAR | Status: DC | PRN

## 2013-09-30 MED ORDER — WITCH HAZEL-GLYCERIN EX PADS: 1.0000 "application " | MEDICATED_PAD | CUTANEOUS | Status: DC | PRN

## 2013-09-30 MED ORDER — SODIUM CHLORIDE 0.9 % IJ SOLN: 3.0000 mL | INTRAMUSCULAR | Status: DC | PRN

## 2013-09-30 MED ORDER — DIBUCAINE 1 % RE OINT: 1.0000 "application " | TOPICAL_OINTMENT | RECTAL | Status: DC | PRN

## 2013-09-30 MED ORDER — KETOROLAC TROMETHAMINE 60 MG/2ML IM SOLN
60.0000 mg | Freq: Once | INTRAMUSCULAR | Status: AC | PRN
  Administered 2013-09-30: 60 mg via INTRAMUSCULAR

## 2013-09-30 MED ORDER — DIPHENHYDRAMINE HCL 25 MG PO CAPS
25.0000 mg | ORAL_CAPSULE | ORAL | Status: DC | PRN
  Administered 2013-09-30: 25 mg via ORAL
  Filled 2013-09-30: qty 1

## 2013-09-30 MED ORDER — NALBUPHINE HCL 10 MG/ML IJ SOLN: 5.0000 mg | INTRAMUSCULAR | Status: DC | PRN

## 2013-09-30 MED ORDER — CEFAZOLIN SODIUM-DEXTROSE 2-3 GM-% IV SOLR
2.0000 g | INTRAVENOUS | Status: DC
Start: 2013-10-01 — End: 2013-09-30

## 2013-09-30 MED ORDER — OXYTOCIN 40 UNITS IN LACTATED RINGERS INFUSION - SIMPLE MED: 62.5000 mL/h | INTRAVENOUS | Status: AC

## 2013-09-30 MED ORDER — ONDANSETRON HCL 4 MG PO TABS: 4.0000 mg | ORAL_TABLET | ORAL | Status: DC | PRN

## 2013-09-30 MED ORDER — MEPERIDINE HCL 25 MG/ML IJ SOLN: 6.2500 mg | INTRAMUSCULAR | Status: DC | PRN

## 2013-09-30 MED ORDER — ONDANSETRON HCL 4 MG/2ML IJ SOLN: 4.0000 mg | INTRAMUSCULAR | Status: DC | PRN

## 2013-09-30 MED ORDER — NALOXONE HCL 0.4 MG/ML IJ SOLN: 0.4000 mg | INTRAMUSCULAR | Status: DC | PRN

## 2013-09-30 MED ORDER — KETOROLAC TROMETHAMINE 30 MG/ML IJ SOLN: 30.0000 mg | Freq: Four times a day (QID) | INTRAMUSCULAR | Status: AC | PRN

## 2013-09-30 MED ORDER — MENTHOL 3 MG MT LOZG: 1.0000 | LOZENGE | OROMUCOSAL | Status: DC | PRN

## 2013-09-30 MED ORDER — IBUPROFEN 600 MG PO TABS
600.0000 mg | ORAL_TABLET | Freq: Four times a day (QID) | ORAL | Status: DC
  Administered 2013-09-30 – 2013-10-03 (×11): 600 mg via ORAL
  Filled 2013-09-30 (×11): qty 1

## 2013-09-30 MED ORDER — PRENATAL MULTIVITAMIN CH
1.0000 | ORAL_TABLET | Freq: Every day | ORAL | Status: DC
  Administered 2013-10-01 – 2013-10-03 (×3): 1 via ORAL
  Filled 2013-09-30 (×3): qty 1

## 2013-09-30 MED ORDER — DIPHENHYDRAMINE HCL 25 MG PO CAPS: 25.0000 mg | ORAL_CAPSULE | ORAL | Status: DC | PRN

## 2013-09-30 MED ORDER — ONDANSETRON HCL 4 MG/2ML IJ SOLN: 4.0000 mg | Freq: Three times a day (TID) | INTRAMUSCULAR | Status: DC | PRN

## 2013-09-30 MED ORDER — DIPHENHYDRAMINE HCL 50 MG/ML IJ SOLN: 12.5000 mg | INTRAMUSCULAR | Status: DC | PRN

## 2013-09-30 MED ORDER — TETANUS-DIPHTH-ACELL PERTUSSIS 5-2.5-18.5 LF-MCG/0.5 IM SUSP
0.5000 mL | Freq: Once | INTRAMUSCULAR | Status: AC
  Administered 2013-10-01: 0.5 mL via INTRAMUSCULAR

## 2013-09-30 MED ORDER — SIMETHICONE 80 MG PO CHEW
80.0000 mg | CHEWABLE_TABLET | ORAL | Status: DC
  Administered 2013-09-30 – 2013-10-02 (×3): 80 mg via ORAL
  Filled 2013-09-30 (×3): qty 1

## 2013-09-30 MED ORDER — KETOROLAC TROMETHAMINE 60 MG/2ML IM SOLN
INTRAMUSCULAR | Status: AC
  Administered 2013-09-30: 60 mg via INTRAMUSCULAR
  Filled 2013-09-30: qty 2

## 2013-09-30 MED ORDER — PHENYLEPHRINE 8 MG IN D5W 100 ML (0.08MG/ML) PREMIX OPTIME
INJECTION | INTRAVENOUS | Status: DC | PRN
  Administered 2013-09-30: 60 ug/min via INTRAVENOUS

## 2013-09-30 MED ORDER — SIMETHICONE 80 MG PO CHEW: 80.0000 mg | CHEWABLE_TABLET | ORAL | Status: DC | PRN

## 2013-09-30 MED ORDER — NALOXONE HCL 1 MG/ML IJ SOLN
1.0000 ug/kg/h | INTRAVENOUS | Status: DC | PRN
  Filled 2013-09-30: qty 2

## 2013-09-30 MED ORDER — SENNOSIDES-DOCUSATE SODIUM 8.6-50 MG PO TABS
2.0000 | ORAL_TABLET | ORAL | Status: DC
  Administered 2013-09-30 – 2013-10-02 (×3): 2 via ORAL
  Filled 2013-09-30 (×3): qty 2

## 2013-09-30 MED ORDER — ONDANSETRON HCL 4 MG/2ML IJ SOLN
INTRAMUSCULAR | Status: AC
  Filled 2013-09-30: qty 2

## 2013-09-30 MED ORDER — NALOXONE HCL 1 MG/ML IJ SOLN: 1.0000 ug/kg/h | INTRAVENOUS | Status: DC | PRN

## 2013-09-30 MED ORDER — ONDANSETRON HCL 4 MG/2ML IJ SOLN
INTRAMUSCULAR | Status: DC | PRN
  Administered 2013-09-30: 4 mg via INTRAVENOUS

## 2013-09-30 MED ORDER — OXYCODONE-ACETAMINOPHEN 5-325 MG PO TABS
1.0000 | ORAL_TABLET | ORAL | Status: DC | PRN
  Administered 2013-10-01: 1 via ORAL
  Administered 2013-10-01: 2 via ORAL
  Administered 2013-10-01: 1 via ORAL
  Administered 2013-10-02: 2 via ORAL
  Administered 2013-10-02: 1 via ORAL
  Administered 2013-10-02 – 2013-10-03 (×5): 2 via ORAL
  Filled 2013-09-30: qty 1
  Filled 2013-09-30 (×7): qty 2
  Filled 2013-09-30: qty 1
  Filled 2013-09-30: qty 2

## 2013-09-30 MED ORDER — HYDROMORPHONE HCL PF 1 MG/ML IJ SOLN
0.2500 mg | INTRAMUSCULAR | Status: DC | PRN
  Administered 2013-09-30 (×2): 0.5 mg via INTRAVENOUS

## 2013-09-30 MED ORDER — SIMETHICONE 80 MG PO CHEW
80.0000 mg | CHEWABLE_TABLET | Freq: Three times a day (TID) | ORAL | Status: DC
  Administered 2013-10-01 – 2013-10-03 (×7): 80 mg via ORAL
  Filled 2013-09-30 (×8): qty 1

## 2013-09-30 MED ORDER — KETOROLAC TROMETHAMINE 30 MG/ML IJ SOLN: 30.0000 mg | Freq: Four times a day (QID) | INTRAMUSCULAR | Status: DC | PRN

## 2013-09-30 MED ORDER — KETOROLAC TROMETHAMINE 60 MG/2ML IM SOLN
60.0000 mg | Freq: Once | INTRAMUSCULAR | Status: DC | PRN
  Filled 2013-09-30: qty 2

## 2013-09-30 MED ORDER — PROMETHAZINE HCL 25 MG/ML IJ SOLN: 6.2500 mg | INTRAMUSCULAR | Status: DC | PRN

## 2013-09-30 MED ORDER — SCOPOLAMINE 1 MG/3DAYS TD PT72
MEDICATED_PATCH | TRANSDERMAL | Status: AC
  Administered 2013-09-30: 1.5 mg via TRANSDERMAL
  Filled 2013-09-30: qty 1

## 2013-09-30 MED ORDER — MORPHINE SULFATE 0.5 MG/ML IJ SOLN
INTRAMUSCULAR | Status: AC
  Filled 2013-09-30: qty 10

## 2013-09-30 MED ORDER — DIPHENHYDRAMINE HCL 25 MG PO CAPS
25.0000 mg | ORAL_CAPSULE | Freq: Four times a day (QID) | ORAL | Status: DC | PRN
  Filled 2013-09-30: qty 1

## 2013-09-30 MED ORDER — OXYTOCIN 10 UNIT/ML IJ SOLN
40.0000 [IU] | INTRAVENOUS | Status: DC | PRN
  Administered 2013-09-30: 40 [IU] via INTRAVENOUS

## 2013-09-30 MED ORDER — BUPIVACAINE IN DEXTROSE 0.75-8.25 % IT SOLN
INTRATHECAL | Status: DC | PRN
  Administered 2013-09-30: 1.6 mL via INTRATHECAL

## 2013-09-30 MED ORDER — LACTATED RINGERS IV SOLN: INTRAVENOUS | Status: DC

## 2013-09-30 MED ORDER — SCOPOLAMINE 1 MG/3DAYS TD PT72
1.0000 | MEDICATED_PATCH | Freq: Once | TRANSDERMAL | Status: DC
Start: 2013-09-30 — End: 2013-09-30

## 2013-09-30 MED ORDER — FENTANYL CITRATE 0.05 MG/ML IJ SOLN
INTRAMUSCULAR | Status: AC
  Filled 2013-09-30: qty 2

## 2013-09-30 MED ORDER — SODIUM CHLORIDE 0.9 % IV SOLN
2.0000 g | Freq: Once | INTRAVENOUS | Status: DC
  Filled 2013-09-30: qty 2000

## 2013-09-30 MED ORDER — ZOLPIDEM TARTRATE 5 MG PO TABS: 5.0000 mg | ORAL_TABLET | Freq: Every evening | ORAL | Status: DC | PRN

## 2013-09-30 MED ORDER — CEFAZOLIN SODIUM-DEXTROSE 2-3 GM-% IV SOLR
INTRAVENOUS | Status: AC
  Filled 2013-09-30: qty 50

## 2013-09-30 MED ORDER — MORPHINE SULFATE (PF) 0.5 MG/ML IJ SOLN
INTRAMUSCULAR | Status: DC | PRN
  Administered 2013-09-30: .15 mg via INTRATHECAL

## 2013-09-30 SURGICAL SUPPLY — 33 items
BARRIER ADHS 3X4 INTERCEED (GAUZE/BANDAGES/DRESSINGS) IMPLANT
BLADE SURG 10 STRL SS (BLADE) ×6 IMPLANT
CLAMP CORD UMBIL (MISCELLANEOUS) IMPLANT
CLOTH BEACON ORANGE TIMEOUT ST (SAFETY) ×3 IMPLANT
DRAPE LG THREE QUARTER DISP (DRAPES) IMPLANT
DRSG OPSITE POSTOP 4X10 (GAUZE/BANDAGES/DRESSINGS) ×3 IMPLANT
DURAPREP 26ML APPLICATOR (WOUND CARE) ×3 IMPLANT
ELECT REM PT RETURN 9FT ADLT (ELECTROSURGICAL) ×3
ELECTRODE REM PT RTRN 9FT ADLT (ELECTROSURGICAL) ×1 IMPLANT
EXTRACTOR VACUUM KIWI (MISCELLANEOUS) IMPLANT
GLOVE BIO SURGEON STRL SZ 6.5 (GLOVE) ×2 IMPLANT
GLOVE BIO SURGEONS STRL SZ 6.5 (GLOVE) ×1
GLOVE BIOGEL PI IND STRL 7.0 (GLOVE) ×1 IMPLANT
GLOVE BIOGEL PI INDICATOR 7.0 (GLOVE) ×2
GOWN STRL REUS W/TWL LRG LVL3 (GOWN DISPOSABLE) ×12 IMPLANT
KIT ABG SYR 3ML LUER SLIP (SYRINGE) IMPLANT
NEEDLE HYPO 25X5/8 SAFETYGLIDE (NEEDLE) IMPLANT
NS IRRIG 1000ML POUR BTL (IV SOLUTION) ×3 IMPLANT
PACK C SECTION WH (CUSTOM PROCEDURE TRAY) ×3 IMPLANT
PAD ABD 7.5X8 STRL (GAUZE/BANDAGES/DRESSINGS) ×3 IMPLANT
PAD OB MATERNITY 4.3X12.25 (PERSONAL CARE ITEMS) ×3 IMPLANT
RETAINER VISCERAL (MISCELLANEOUS) ×3 IMPLANT
RETRACTOR WND ALEXIS 25 LRG (MISCELLANEOUS) ×1 IMPLANT
RTRCTR WOUND ALEXIS 25CM LRG (MISCELLANEOUS) ×3
SPONGE GAUZE 4X4 12PLY STER LF (GAUZE/BANDAGES/DRESSINGS) ×3 IMPLANT
SUT VIC AB 0 CT1 36 (SUTURE) ×12 IMPLANT
SUT VIC AB 2-0 CT1 27 (SUTURE) ×2
SUT VIC AB 2-0 CT1 TAPERPNT 27 (SUTURE) ×1 IMPLANT
SUT VIC AB 4-0 PS2 27 (SUTURE) ×3 IMPLANT
TAPE CLOTH SURG 4X10 WHT LF (GAUZE/BANDAGES/DRESSINGS) ×3 IMPLANT
TOWEL OR 17X24 6PK STRL BLUE (TOWEL DISPOSABLE) ×6 IMPLANT
TRAY FOLEY CATH 14FR (SET/KITS/TRAYS/PACK) IMPLANT
WATER STERILE IRR 1000ML POUR (IV SOLUTION) IMPLANT

## 2013-09-30 NOTE — Progress Notes (Signed)
Chaplain follow up to deal with continued stress which could cause unbeneficial affects on the unborn children.  Chaplain and Ms Sebree spoke on current stressers concentrating on relational and occupational issues arising from being in the hospital.   Ms Furniss requested further chaplain support in the future to assist her keep her stress and anxiety level down while pregnant on bed rest in the hospital.   Page chaplain as Ms Krog requires further support and care  Leonette Most D. Friedrich Harriott, DMin Chaplain

## 2013-09-30 NOTE — Progress Notes (Signed)
[name] is a 23 y.o. G3P0020 at [redacted]w[redacted]d by ultrasound admitted for Preterm labor, PROM, twins  Subjective:more contractions and lower abdominal pressure, increased bleeding   Objective: BP 122/76  Pulse 93  Temp(Src) 98.2 F (36.8 C) (Oral)  Resp 12  Ht  (1.549 m)  Wt 140 lb (63.504 kg)  BMI 26.47 kg/m2  SpO2 100%  LMP 04/05/2013   Total I/O In: -  Out: 800 [Urine:800]  FHT:  FHR: 140-150 bpm, variability: moderate,  accelerations:  Present,  decelerations:  Present mild variables baby A UC:   irregular, every 3-5 minutes Cephalic, +2 moderate blood Labs: Lab Results  Component Value Date   WBC 16.7* 09/30/2013   HGB 8.6* 09/30/2013   HCT 25.5* 09/30/2013   MCV 93.8 09/30/2013   PLT 255 09/30/2013   [place] cephalic/breech on right Assessment / Plan: Twins, preterm, labor progressive  Labor: progressed Preeclampsia:  no signs or symptoms of toxicity Fetal Wellbeing:  Category I and Category II Pain Control:  none I/D:  on ABX, unknown GBS Anticipated MOD:  Discussed anticipated pending delivery and breech presentation of twin B, risk and potential benefit of vaginal vs cesarean birth twin B. She feel that cesarean would be desired to decrease risk of delivery so will proceed. Risk of anesthesia bleeding transfusion pain infection visceral organ damge discussed and questions answered. Last had milk product at 1330  Aimee Santiago 09/30/2013, 3:32 PM

## 2013-09-30 NOTE — Progress Notes (Signed)
Patient ID: Aimee Santiago, female   DOB: 1990/11/13, 23 y.o.   MRN: [id number] FACULTY PRACTICE ANTEPARTUM(COMPREHENSIVE) NOTE  QUANEISHA HANISCH is a 23 y.o. G3P0020 at [redacted]w[redacted]d by early ultrasound who is admitted for PROM, twin gestation preterm labor.   Fetal presentation is cephalic and breech. Length of Stay:  10  Days  Subjective: Occasional mild contractions, leaking AF Patient reports the fetal movement as active. Patient reports uterine contraction  activity as irregular, every 20-30 minutes. Patient reports  vaginal bleeding as scant staining. Patient describes fluid per vagina as Other blood stained.  Vitals:  Blood pressure 122/76, pulse 107, temperature 98.2 F (36.8 C), temperature source Oral, resp. rate 12, height  (1.549 m), weight 140 lb (63.504 kg), last menstrual period 04/05/2013, SpO2 100.00%. Physical Examination:  General appearance - alert, well appearing, and in no distress Heart - normal rate and regular rhythm Abdomen - soft, nontender, nondistended Fundal Height:  consistent with twins not tender Cervical Exam: Not evaluated. Extremities: extremities normal, atraumatic, no cyanosis or edema and Homans sign is negative, no sign of DVT  Membranes:ruptured  Fetal Monitoring:  Baseline: 140-150 bpm and occasional mild variable twin A  Labs:  Results for orders placed during the hospital encounter of 09/20/13 (from the past 24 hour(s))  CBC   Collection Time    09/30/13  5:20 AM      Result Value Ref Range   WBC 16.7 (*) 4.0 - 10.5 K/uL   RBC 2.72 (*) 3.87 - 5.11 MIL/uL   Hemoglobin 8.6 (*) 12.0 - 15.0 g/dL   HCT 40.9 (*) 81.1 - 91.4 %   MCV 93.8  78.0 - 100.0 fL   MCH 31.6  26.0 - 34.0 pg   MCHC 33.7  30.0 - 36.0 g/dL   RDW 78.2  95.6 - 21.3 %   Platelets 255  150 - 400 K/uL  TYPE AND SCREEN   Collection Time    09/30/13  5:20 AM      Result Value Ref Range   ABO/RH(D) AB POS     Antibody Screen NEG     Sample Expiration 10/03/2013       Imaging Studies:     Currently EPIC will not allow sonographic studies to automatically populate into notes.  In the meantime, copy and paste results into note or free text.  Medications:  Scheduled . amoxicillin  500 mg Oral 3 times per day  . azithromycin  500 mg Oral Daily  . indomethacin  25 mg Oral Q4H  . magnesium  6 g Intravenous Once  . nicotine  7 mg Transdermal Daily  . NIFEdipine  30 mg Oral BID  . terbutaline  0.25 mg Subcutaneous Once   I have reviewed the patient's current medications.  ASSESSMENT: Patient Active Problem List   Diagnosis Date Noted  . Preterm labor 09/20/2013  . Supervision of other high-risk pregnancy(V23.89) 07/24/2013  . Twins 07/24/2013    PLAN: Continue present management, monitor for s/sx PTL  Norvell Ureste 09/30/2013,10:02 AM

## 2013-09-30 NOTE — Anesthesia Procedure Notes (Signed)
Spinal  Patient location during procedure: OR Staffing Anesthesiologist: Nolon Nations R Performed by: anesthesiologist  Preanesthetic Checklist Completed: patient identified, site marked, surgical consent, pre-op evaluation, timeout performed, IV checked, risks and benefits discussed and monitors and equipment checked Spinal Block Patient position: sitting Prep: Betadine Patient monitoring: heart rate, continuous pulse ox and blood pressure Approach: left paramedian Location: L2-3 Injection technique: single-shot Needle Needle type: Sprotte  Needle gauge: 24 G Needle length: 9 cm Assessment Sensory level: T8 Additional Notes Expiration date of kit checked and confirmed. Patient tolerated procedure well, without complications.

## 2013-09-30 NOTE — Anesthesia Preprocedure Evaluation (Signed)
Anesthesia Evaluation  Patient identified by MRN, date of birth, ID band Patient awake    Reviewed: Allergy & Precautions, H&P , NPO status , Patient's Chart, lab work & pertinent test results  Airway Mallampati: II TM Distance: >3 FB Neck ROM: full    Dental   Pulmonary Current Smoker,  breath sounds clear to auscultation        Cardiovascular negative cardio ROS  Rhythm:regular Rate:Normal     Neuro/Psych  Headaches, PSYCHIATRIC DISORDERS Bipolar Disorder    GI/Hepatic negative GI ROS, Neg liver ROS,   Endo/Other  negative endocrine ROS  Renal/GU negative Renal ROS     Musculoskeletal negative musculoskeletal ROS (+)   Abdominal   Peds  Hematology negative hematology ROS (+)   Anesthesia Other Findings Chronic headaches     Chronic back pain         Bipolar 1 disorder  Twins (breech vertex)  Reproductive/Obstetrics (+) Pregnancy                           Anesthesia Physical  Anesthesia Plan  ASA: II and emergent  Anesthesia Plan: Spinal   Post-op Pain Management:    Induction:   Airway Management Planned:   Additional Equipment:   Intra-op Plan:   Post-operative Plan:   Informed Consent: I have reviewed the patients History and Physical, chart, labs and discussed the procedure including the risks, benefits and alternatives for the proposed anesthesia with the patient or authorized representative who has indicated his/her understanding and acceptance.   Dental advisory given  Plan Discussed with: CRNA  Anesthesia Plan Comments:         Anesthesia Quick Evaluation

## 2013-09-30 NOTE — Consult Note (Signed)
Neonatology Note:   Attendance at C-section:    I was asked by Dr. Debroah Loop to attend this primary C/S at 25 3/[redacted] weeks GA due to onset of vaginal bleeding and twin gestation. The mother is a G3P0A2 AB pos, GBS neg (urine culture negative 07/24/13) with bipolar disorder, chronic headache and back ache, and mono/di twin pregnancy. UDS was positive for THC and patient is a cigarette smoker, on a nicotine patch.ROM occurred at 1150 on 8/27 (3 days PTD), fluid clear with pink tinge. The mother received Betamethasone 8/20-21 and has been on IV Ampicillin and Azithromycin. She has also gotten Magnesium sulfate and Indomethacin.  Twin A, a female, delivered double footling breech. This was the breech twin in utero. Infant was floppy, cyanotic, and had one weak cry, then became apneic. We bulb suctioned, then quickly dried the baby and placed her into the portawarmer bag for temp support. Her HR was about 80 and she was not breathing, so we applied PPV with the neopuff. Her HR came up, but she did not breath sufficiently, even with neopuff support, to keep her HR above 100. Avis Epley attempted intubation at about 2.5 min of life, without success. We gave PPV for another 30 seconds, then I intubated the baby with a 2.5 mm ETT atraumatically to a depth of 6.5 cm at the lips. The CO2 detector turned yellow immediately and bilateral breath sounds could be heard. The HR came up above 100 quickly. We placed a pulse oximeter and titrated the FIO2 to keep the O2 saturations in the 85-90 range, requiring about 60% initially, but able to wean to 30% before leaving the DR. We secured the ETT, placed the baby into the transport isolette, showed her briefly to the parents, then took her to the NICU for further care. Ap 5/9.   Twin B, a female, was delivered double footling breech. This infant was lower in the pelvis and had been vertex prior to the C-section. Infant cried twice at birth, and had fairly good tone. Bulb suctioned,  quickly dried, and placed into the portawarmer bag for temp support. She had a dip in her HR just after 1 minute of life, so I placed the neopuff on her and gave her a few PPV breaths, followed by continuing neopuff CPAP. She did well after that, maintaining good O2 saturations, and we were able to wean her FIO2 to 30% before leaving the DR. Ap 8/9. Viewed briefly by the mother in the OR, then transported to the NICU for further care.   Doretha Sou, MD

## 2013-09-30 NOTE — Op Note (Signed)
Cesarean Section Procedure Note   Aimee Santiago  09/20/2013 - 09/30/2013  Indications: Breech Presentation and twins preterm labor and vaginal bleeding   Pre-operative Diagnosis: * No pre-op diagnosis entered *.   Post-operative Diagnosis: Same   Surgeon: Surgeon(s) and Role:    * Adam Phenix, MD - Primary   Assistants: none  Anesthesia: spinal   Procedure Details:  The patient was seen in the labor and delivery. The risks, benefits, complications, treatment options, and expected outcomes were discussed with the patient. The patient concurred with the proposed plan, giving informed consent. identified as Aimee Santiago and the procedure verified as C-Section Delivery. A Time Out was held and the above information confirmed.  After induction of anesthesia, the patient was draped and prepped in the usual sterile manner. A transverse was made and carried down through the subcutaneous tissue to the fascia. Fascial incision was made and extended transversely. The fascia was separated from the underlying rectus tissue superiorly and inferiorly. The peritoneum was identified and entered. Peritoneal incision was extended longitudinally. The utero-vesical peritoneal reflection was incised transversely and the bladder flap was bluntly freed from the lower uterine segment. A low transverse uterine incision was made. Delivered from breech  presentation was a premature Female from the right.The cephalic infant that was twin A was delivered. Cord ph was sent the umbilical cord was clamped and cut cord blood was obtained for evaluation. The placenta was removed Intact and appeared normal. The uterine outline, tubes and ovaries appeared normal. The uterine incision was closed with running locked sutures of 0Vicryl.   Hemostasis was observed. Lavage was carried out until clear. The fascia was then reapproximated with running sutures of 0Vicryl. The subcuticular closure was performed using 4-0Vicryl.  Instrument, sponge, and needle counts were correct prior the abdominal closure and were correct at the conclusion of the case.    Findings: Twin A breech on right twin B cephalic head in pelvis   Estimated Blood Loss: 600 ml  Total IV Fluids:   Urine Output: 100CC OF clear urine  Specimens: @   Complications: no complications  Disposition: PACU - hemodynamically stable.   Maternal Condition: stable   Baby condition / location:  NICU  Attending Attestation: I was present and scrubbed for the entire procedure.   Signed: Adam Phenix, MD 09/30/2013 6:01 PM

## 2013-09-30 NOTE — Transfer of Care (Signed)
Immediate Anesthesia Transfer of Care Note  Patient: Aimee Santiago  Procedure(s) Performed: Procedure(s): CESAREAN SECTION (N/A)  Patient Location: PACU  Anesthesia Type:Spinal  Level of Consciousness: awake and alert   Airway & Oxygen Therapy: Patient Spontanous Breathing  Post-op Assessment: Report given to PACU RN and Post -op Vital signs reviewed and stable  Post vital signs: Reviewed and stable  Complications: No apparent anesthesia complications

## 2013-10-01 ENCOUNTER — Encounter (HOSPITAL_COMMUNITY): Payer: Self-pay | Admitting: Obstetrics & Gynecology

## 2013-10-01 LAB — CBC
HCT: 22.3 % — ABNORMAL LOW (ref 36.0–46.0)
Hemoglobin: 7.5 g/dL — ABNORMAL LOW (ref 12.0–15.0)
MCH: 31.3 pg (ref 26.0–34.0)
MCHC: 33.6 g/dL (ref 30.0–36.0)
MCV: 92.9 fL (ref 78.0–100.0)
Platelets: 242 10*3/uL (ref 150–400)
RBC: 2.4 MIL/uL — AB (ref 3.87–5.11)
RDW: 12.7 % (ref 11.5–15.5)
WBC: 18.9 10*3/uL — AB (ref 4.0–10.5)

## 2013-10-01 NOTE — Progress Notes (Signed)
I spent time with Aimee Santiago and later with her mother.  She is worried about her babies and concerned about logistics of birth certificates as well as the move her boyfriend is coordinating for them today.  Her mother continues to be supportive and she continues to appreciate chaplain support as well.  We will keep following up with her, but please also page as needs arise, [phone].  Chaplain Dyanne Carrel 3:47 PM   10/01/13 1500  Clinical Encounter Type  Visited With Patient;Patient and family together  Visit Type Follow-up;Spiritual support

## 2013-10-01 NOTE — Anesthesia Postprocedure Evaluation (Signed)
  Anesthesia Post-op Note  Patient: Aimee Santiago  Procedure(s) Performed: Procedure(s): CESAREAN SECTION (N/A)  Patient Location: Women's unit  Anesthesia Type:Spinal  Level of Consciousness: awake, alert  and oriented  Airway and Oxygen Therapy: Patient Spontanous Breathing  Post-op Pain: none  Post-op Assessment: Post-op Vital signs reviewed, Patient's Cardiovascular Status Stable, Respiratory Function Stable, No headache, No backache, No residual numbness and No residual motor weakness  Post-op Vital Signs: Reviewed and stable  Last Vitals:  Filed Vitals:   10/01/13 0935  BP: 121/77  Pulse: 81  Temp: 36.6 C  Resp: 20    Complications: No apparent anesthesia complications

## 2013-10-01 NOTE — Progress Notes (Signed)
Post Partum Day 1 Subjective:  Aimee Santiago is a 23 y.o. [id number] [redacted]w[redacted]d s/p pLTCS 2/2 vaginal bleeding.  No acute events overnight.  Pt denies problems with ambulating, voiding or po intake.  She denies nausea or vomiting.  Pain is moderately controlled.  She has had flatus. She has not had bowel movement.  Lochia Moderate.    Objective: Blood pressure 102/53, pulse 71, temperature 98.8 F (37.1 C), temperature source Oral, resp. rate 18, height  (1.549 m), weight 140 lb (63.504 kg), last menstrual period 04/05/2013, SpO2 100.00%, unknown if currently breastfeeding.  Physical Exam:  General: alert, cooperative and no distress Lochia:normal flow Chest: CTAB Heart: RRR no m/r/g Abdomen: +BS, soft, nontender,  Uterine Fundus: firm DVT Evaluation: No evidence of DVT seen on physical exam. Extremities: no edema   Recent Labs  09/30/13 0520 10/01/13 0515  HGB 8.6* 7.5*  HCT 25.5* 22.3*    Assessment/Plan:  ASSESSMENT: Aimee Santiago is a 23 y.o. [id number] [redacted]w[redacted]d s/p pLTCS, babies in NICU  Plan for discharge tomorrow   LOS: 11 days   Aimee Santiago ROCIO 10/01/2013, 7:30 AM

## 2013-10-01 NOTE — Progress Notes (Signed)
Ur chart review completed.  

## 2013-10-01 NOTE — Anesthesia Postprocedure Evaluation (Signed)
Anesthesia Post Note  Patient: Aimee Santiago  Procedure(s) Performed: Procedure(s) (LRB): CESAREAN SECTION (N/A)  Anesthesia type: Spinal  Patient location: PACU  Post pain: Pain level controlled  Post assessment: Post-op Vital signs reviewed  Last Vitals: BP 122/82  Pulse 78  Temp(Src) 37.1 C (Oral)  Resp 18  Ht  (1.549 m)  Wt 140 lb (63.504 kg)  BMI 26.47 kg/m2  SpO2 100%  LMP 04/05/2013  Breastfeeding? Unknown  Post vital signs: Reviewed  Level of consciousness: sedated  Complications: No apparent anesthesia complications

## 2013-10-01 NOTE — Lactation Note (Signed)
This note was copied from the chart of Aimee Elysha Vensel. Lactation Consultation Note    Initial consult with this mom of NICU twins now 20 hours old, and 25 4/7 weeks CGA. Mom began pumping this morning. She was able to express about 1 ml of colostrum, between pumping and hand expression. I decreased mom from 27 to 24 flanges, with a better fit. I showed mom how to set pump for premie setting, and reviewed the NICU booklet on how to provide EBm for a NICU baby. These are mom's first babies.  Mom is receiving a Medela DEP from a fried. She is [place] WIC [place]. [place].  Mom knows to call for questions/concerns. Skin to skin encouraged.  Patient Name: Aimee Santiago Today's Date: 10/01/2013 Reason for consult: Initial assessment;NICU baby;Multiple gestation;Other (Comment) (25 4/7 weeks twins)   Maternal Data Formula Feeding for Exclusion: Yes (babies are in the NICU) Reason for exclusion: Mother's choice to formula and breast feed on admission Has patient been taught Hand Expression?: Yes Does the patient have breastfeeding experience prior to this delivery?: No  Feeding    LATCH Score/Interventions                      Lactation Tools Discussed/Used [place]IC Program: [place] Pump Review: Setup, frequency, and cleaning;Milk Storage;Other (comment) (premie setting, hand expression, review of NICU booklet on EBM) Initiated by:: bedside RN Date initiated:: 10/01/13   Consult Status Consult Status: Follow-up Date: 10/02/13 Follow-up type: In-patient    Arieana Somoza Anne 10/01/2013, 1:12 PM    

## 2013-10-01 NOTE — Addendum Note (Signed)
Addendum created 10/01/13 1530 by Shanon Payor, CRNA   Modules edited: Notes Section   Notes Section:  File: [id number]

## 2013-10-02 NOTE — Lactation Note (Signed)
This note was copied from the chart of [name]. Lactation Consultation Note    Follow up consult with this mom of a NICU twins, now 42 hours old, and 25 5/7 weeks CGA. Mom is pumping every 2-4 hours, and expressing drops of colostrum. I advised her to try and pump every 3 hours. She is getting a DEP from her friend today. She was told about sterilization bags that can be used with a microwave, so she can sterilize her friend parts and tubing. She is also aware she can purchase new tubing if needed. Mom is also working on transferring her WIC to [employer] - she did speak to [facility] from [place] yesterday. She will not need a pump, only vouchers, from [facility].  Patient Name: Aimee Santiago [id number] Date: 10/02/2013 Reason for consult: Follow-up assessment;NICU baby;Multiple gestation   Maternal Data    Feeding    LATCH Score/Interventions                      Lactation Tools Discussed/Used     Consult Status Consult Status: Follow-up Date: 10/03/13 Follow-up type: In-patient    Alfred Levins 10/02/2013, 1:44 PM
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
## 2013-10-03 DIAGNOSIS — Z98891 History of uterine scar from previous surgery: Secondary | ICD-10-CM

## 2013-10-03 MED ORDER — IBUPROFEN 600 MG PO TABS: 600.0000 mg | ORAL_TABLET | Freq: Four times a day (QID) | ORAL | Status: DC

## 2013-10-03 MED ORDER — SIMETHICONE 80 MG PO CHEW: 80.0000 mg | CHEWABLE_TABLET | Freq: Four times a day (QID) | ORAL | Status: DC | PRN

## 2013-10-03 MED ORDER — DOCUSATE SODIUM 100 MG PO CAPS: 100.0000 mg | ORAL_CAPSULE | Freq: Two times a day (BID) | ORAL | Status: DC | PRN

## 2013-10-03 MED ORDER — OXYCODONE-ACETAMINOPHEN 5-325 MG PO TABS: 1.0000 | ORAL_TABLET | ORAL | Status: DC | PRN

## 2013-10-03 MED ORDER — NICOTINE 14 MG/24HR TD PT24: 14.0000 mg | MEDICATED_PATCH | Freq: Every day | TRANSDERMAL | Status: DC

## 2013-10-03 NOTE — Discharge Instructions (Signed)

## 2013-10-03 NOTE — Discharge Summary (Signed)
Obstetric Discharge Summary Reason for Admission: preterm labor  Admission note: 23 y.o. [id number] pt of FT was admitted on 09/20/13 for preterm labor and remained in [facility] until delivery  on 09/30/13.  BMZ x 2 doses given, Magnesium x 24 hours, then Indocin x 3 courses while pt hospitalized.  Pt cervix progressed to complete with BBOW, then PPROM. Abx administered for PPROM.  On 09/30/13, pt had increased vaginal bleeding and contractions and was delivered by PLTCS.  Infants in NICU.  Prenatal Procedures: NST and ultrasound Intrapartum Procedures: cesarean: low cervical, transverse Postpartum Procedures: none Complications-Operative and Postpartum: none Hemoglobin  Date Value Ref Range Status  10/01/2013 7.5* 12.0 - 15.0 g/dL Final     HCT  Date Value Ref Range Status  10/01/2013 22.3* 36.0 - 46.0 % Final    Physical Exam:  General: alert, cooperative and no distress Lochia: appropriate Uterine Fundus: firm Incision: healing well, no significant drainage, honeycomb dressing in place, incision visible underneath well approximated, no visible edema or erythema DVT Evaluation: No evidence of DVT seen on physical exam. Negative Homan's sign. No cords or calf tenderness. Bilateral edema +2 nonpitting lower extremities  Discharge Diagnoses: Premature labor and PLTCS    Discharge Information: Date: 10/03/2013 Activity: pelvic rest Diet: routine Medications: Ibuprofen, Colace, Percocet and Gas X Condition: stable Instructions: refer to practice specific booklet Discharge to: home Pt plans POC pills for contraception.  Follow-up Information   Follow up with [facility]. Call in 1 week. (to make a routine follow up appointment for 1 week after delivery )    Contact information:   [address] [place] [address] [phone]      Newborn Data:   Damariz, Paganelli [[id number]]  Live born female  Birth Weight: 1 lb 8.3 oz (690 g) APGAR: 5, 9    Chalice, Philbert [[id number]]  Live born female  Birth Weight: 1 lb 10.5 oz (750 g) APGAR: 8, 9  Infants in NICU  LEFTWICH-KIRBY, LISA 10/03/2013, 6:36 AM

## 2013-10-03 NOTE — Progress Notes (Signed)
Delayed entry  Post Partum Day 1 Subjective:  Aimee Santiago is a 23 y.o. [id number] [redacted]w[redacted]d s/p pLTCS.  No acute events overnight.  Pt denies problems with ambulating, voiding or po intake.  She denies nausea or vomiting.  Pain is moderately controlled.  Unsure about contraception  Objective: Blood pressure 115/70, pulse 77, temperature 97.7 F (36.5 C), temperature source Oral, resp. rate 18, height  (1.549 m), weight 140 lb (63.504 kg), last menstrual period 04/05/2013, SpO2 99.00%, unknown if currently breastfeeding.  Physical Exam:  General: alert, cooperative and no distress Lochia:normal flow Chest: CTAB Heart: RRR no m/r/g Abdomen: +BS, soft, nontender,  Uterine Fundus: firm DVT Evaluation: No evidence of DVT seen on physical exam. Extremities: no edema   Recent Labs  10/01/13 0515  HGB 7.5*  HCT 22.3*    Assessment/Plan:  ASSESSMENT: Aimee Santiago is a 23 y.o. [id number] [redacted]w[redacted]d s/p LTCS  Plan for discharge tomorrow Discussed contraception at length, pt wishes to pump/breastfeed, options discussed.   LOS: 13 days   Aimee Santiago 10/03/2013, 7:09 AM

## 2013-10-03 NOTE — Discharge Summary (Signed)
Attestation of Attending Supervision of Advanced Practitioner (CNM/NP): Evaluation and management procedures were performed by the Advanced Practitioner under my supervision and collaboration.  I have reviewed the Advanced Practitioner's note and chart, and I agree with the management and plan.  HARRAWAY-SMITH, Waverley Krempasky 7:31 AM     

## 2013-10-03 NOTE — Progress Notes (Signed)
Discharge teaching complete. Pt understood instructions. Pt ambulated out of the hospital and discharged home to family.

## 2013-10-04 LAB — TYPE AND SCREEN
ABO/RH(D): AB POS
Antibody Screen: NEGATIVE
UNIT DIVISION: 0
UNIT DIVISION: 0
Unit division: 0
Unit division: 0

## 2013-10-09 ENCOUNTER — Encounter: Payer: Medicaid Other | Admitting: Obstetrics & Gynecology

## 2013-10-09 ENCOUNTER — Encounter: Payer: Self-pay | Admitting: Obstetrics & Gynecology

## 2013-10-09 ENCOUNTER — Ambulatory Visit (INDEPENDENT_AMBULATORY_CARE_PROVIDER_SITE_OTHER): Payer: Medicaid Other | Admitting: Obstetrics & Gynecology

## 2013-10-09 VITALS — BP 130/80 | Wt 135.0 lb

## 2013-10-09 DIAGNOSIS — Z98891 History of uterine scar from previous surgery: Secondary | ICD-10-CM

## 2013-10-09 DIAGNOSIS — Z9889 Other specified postprocedural states: Secondary | ICD-10-CM

## 2013-10-09 MED ORDER — PRENATAL COMPLETE 14-0.4 MG PO TABS: 1.0000 | ORAL_TABLET | Freq: Every day | ORAL | Status: DC

## 2013-10-09 MED ORDER — PREDNISONE 10 MG PO TABS: 10.0000 mg | ORAL_TABLET | Freq: Every day | ORAL | Status: DC

## 2013-10-09 MED ORDER — OXYCODONE-ACETAMINOPHEN 5-325 MG PO TABS: 1.0000 | ORAL_TABLET | ORAL | Status: DC | PRN

## 2013-10-09 NOTE — Progress Notes (Signed)
Patient ID: Aimee Santiago, female   DOB: 04-10-1990, 23 y.o.   MRN: [id number] Blood pressure 130/80, weight 135 lb (61.236 kg), last menstrual period 04/05/2013, unknown if currently breastfeeding.  S/p LTCS for twin A breech  Doing well post op Incision clean dry intact  rx percocet PNV Prednisone for drug rash

## 2013-10-14 ENCOUNTER — Ambulatory Visit: Payer: Self-pay

## 2013-10-14 NOTE — Lactation Note (Signed)
This note was copied from the chart of [name]. Lactation Consultation Note  Follow up visit made in NICU.  Mom is concerned because her milk supply has decreased over the past two days.  She was obtaining 4 ounces total with each pumping and now obtaining 2 ounces.  She is using a medela pump in style and pumps every 2-4 hours during daytime hours only.  Mom also reports stress and sadness recently due to difficulty with transportation and missing babies.  We discussed how stress can often decrease supply temporarily.  Recommended she try to pump every 2-3 hours during the day and once during the night.  Encouraged her to bring her pump pieces with her and pump while visiting babies.  Mom will also begin taking fenugreek and mothers love plus tea.  Support given to mom and we will continue to follow up.  Patient Name: Aimee Santiago [id number] Date: 10/14/2013     Maternal Data    Feeding Feeding Type: Breast Milk Length of feed: 15 min  LATCH Score/Interventions                      Lactation Tools Discussed/Used     Consult Status      Aimee Santiago 10/14/2013, 8:18 AM

## 2013-11-06 ENCOUNTER — Ambulatory Visit (INDEPENDENT_AMBULATORY_CARE_PROVIDER_SITE_OTHER): Payer: Medicaid Other | Admitting: Advanced Practice Midwife

## 2013-11-06 ENCOUNTER — Encounter: Payer: Self-pay | Admitting: Advanced Practice Midwife

## 2013-11-06 MED ORDER — METOCLOPRAMIDE HCL 10 MG PO TABS: 10.0000 mg | ORAL_TABLET | Freq: Four times a day (QID) | ORAL | Status: DC

## 2013-11-06 MED ORDER — PRENATAL COMPLETE 14-0.4 MG PO TABS: 1.0000 | ORAL_TABLET | Freq: Every day | ORAL | Status: DC

## 2013-11-06 MED ORDER — LIDOCAINE 5 % EX OINT: 1.0000 "application " | TOPICAL_OINTMENT | CUTANEOUS | Status: DC | PRN

## 2013-11-06 MED ORDER — DOCUSATE SODIUM 100 MG PO CAPS: 100.0000 mg | ORAL_CAPSULE | Freq: Two times a day (BID) | ORAL | Status: DC | PRN

## 2013-11-06 MED ORDER — NAPROXEN 500 MG PO TABS: 500.0000 mg | ORAL_TABLET | Freq: Two times a day (BID) | ORAL | Status: DC

## 2013-11-06 NOTE — Addendum Note (Signed)
Addended by: Jacklyn ShellRESENZO-DISHMON, Jerrye Seebeck on: 11/06/2013 05:00 PM   Modules accepted: Orders

## 2013-11-06 NOTE — Progress Notes (Signed)
  Aimee Santiago is a 23 y.o. who presents for a postpartum visit. She is 5 weeks postpartum following a low cervical transverse Cesarean section. I have fully reviewed the prenatal and intrapartum course. The delivery was at 25.3 gestational weeks. [facility], 8cms dilate with twins, and held on for an additional 10 days before undergoing LTCS for 2nd twin breech, bleeding, labor.  Anesthesia: spinal. Postpartum course has been uneventful. The babies are, of course, still in the NICU, [facility].  She is pumping her milk.  Bleeding: staining only. Bowel function is normal. Bladder function is normal. Patient is sexually active, twice, with a condom Contraception method is condoms. Postpartum depression screening: negative.    Review of Systems   Constitutional: Negative for fever and chills Eyes: Negative for visual disturbances Respiratory: Negative for shortness of breath, dyspnea Cardiovascular: Negative for chest pain or palpitations  Gastrointestinal: Negative for vomiting, diarrhea and constipation Genitourinary: Negative for dysuria and urgency Musculoskeletal: Negative for back pain, joint pain, myalgias  Neurological: Negative for dizziness and headaches   Objective:     Filed Vitals:   11/06/13 1619  BP: 126/82   General:  alert, cooperative and no distress   Breasts:  negative  Lungs: clear to auscultation bilaterally  Heart:  regular rate and rhythm  Abdomen: Soft, nontender   Vulva:  normal  Vagina: normal vagina  Cervix:  closed  Corpus: Well involuted     Rectal Exam: no hemorrhoids        Assessment:    normal postpartum exam.  Plan:    1. Contraception: condoms 2. Follow up as needed

## 2013-11-22 ENCOUNTER — Telehealth: Payer: Self-pay | Admitting: Advanced Practice Midwife

## 2013-12-03 ENCOUNTER — Encounter: Payer: Self-pay | Admitting: Advanced Practice Midwife
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
## 2015-09-08 ENCOUNTER — Emergency Department (HOSPITAL_COMMUNITY)
Admission: EM | Admit: 2015-09-08 | Discharge: 2015-09-09 | Disposition: A | Discharge status: Home / Self Care | Payer: Self-pay | Attending: Emergency Medicine | Admitting: Emergency Medicine

## 2015-09-08 ENCOUNTER — Encounter (HOSPITAL_COMMUNITY): Payer: Self-pay | Admitting: *Deleted

## 2015-09-08 DIAGNOSIS — Z87891 Personal history of nicotine dependence: Secondary | ICD-10-CM | POA: Insufficient documentation

## 2015-09-08 DIAGNOSIS — R1030 Lower abdominal pain, unspecified: Secondary | ICD-10-CM | POA: Insufficient documentation

## 2015-09-08 DIAGNOSIS — Z79899 Other long term (current) drug therapy: Secondary | ICD-10-CM | POA: Insufficient documentation

## 2015-09-08 DIAGNOSIS — N898 Other specified noninflammatory disorders of vagina: Secondary | ICD-10-CM | POA: Insufficient documentation

## 2015-09-08 DIAGNOSIS — R110 Nausea: Secondary | ICD-10-CM | POA: Insufficient documentation

## 2015-09-08 DIAGNOSIS — R102 Pelvic and perineal pain: Secondary | ICD-10-CM | POA: Insufficient documentation

## 2015-09-08 DIAGNOSIS — F458 Other somatoform disorders: Secondary | ICD-10-CM | POA: Insufficient documentation

## 2015-09-08 DIAGNOSIS — R109 Unspecified abdominal pain: Secondary | ICD-10-CM

## 2015-09-08 DIAGNOSIS — R0602 Shortness of breath: Secondary | ICD-10-CM | POA: Insufficient documentation

## 2015-09-08 LAB — URINALYSIS, ROUTINE W REFLEX MICROSCOPIC
BILIRUBIN URINE: NEGATIVE
Glucose, UA: NEGATIVE mg/dL
Hgb urine dipstick: NEGATIVE
Ketones, ur: NEGATIVE mg/dL
Leukocytes, UA: NEGATIVE
Nitrite: NEGATIVE
PROTEIN: NEGATIVE mg/dL
Specific Gravity, Urine: 1.008 (ref 1.005–1.030)
pH: 7.5 (ref 5.0–8.0)

## 2015-09-08 LAB — COMPREHENSIVE METABOLIC PANEL
ALBUMIN: 4.5 g/dL (ref 3.5–5.0)
ALK PHOS: 71 U/L (ref 38–126)
ALT: 19 U/L (ref 14–54)
ANION GAP: 7 (ref 5–15)
AST: 18 U/L (ref 15–41)
BILIRUBIN TOTAL: 0.4 mg/dL (ref 0.3–1.2)
BUN: 13 mg/dL (ref 6–20)
CALCIUM: 9 mg/dL (ref 8.9–10.3)
CO2: 24 mmol/L (ref 22–32)
Chloride: 106 mmol/L (ref 101–111)
Creatinine, Ser: 0.65 mg/dL (ref 0.44–1.00)
GFR calc Af Amer: >60 mL/min (ref >60)
GFR calc non Af Amer: >60 mL/min (ref >60)
GLUCOSE: 90 mg/dL (ref 65–99)
Potassium: 3.8 mmol/L (ref 3.5–5.1)
Sodium: 137 mmol/L (ref 135–145)
Total Protein: 7.3 g/dL (ref 6.5–8.1)

## 2015-09-08 LAB — LIPASE, BLOOD: Lipase: 45 U/L (ref 11–51)

## 2015-09-08 LAB — CBC
HCT: 42 % (ref 36.0–46.0)
Hemoglobin: 13.8 g/dL (ref 12.0–15.0)
MCH: 30.3 pg (ref 26.0–34.0)
MCHC: 32.9 g/dL (ref 30.0–36.0)
MCV: 92.1 fL (ref 78.0–100.0)
Platelets: 339 10*3/uL (ref 150–400)
RBC: 4.56 MIL/uL (ref 3.87–5.11)
RDW: 13.4 % (ref 11.5–15.5)
WBC: 9.4 10*3/uL (ref 4.0–10.5)

## 2015-09-08 LAB — I-STAT BETA HCG BLOOD, ED (MC, WL, AP ONLY): I-stat hCG, quantitative: <5 m[IU]/mL (ref <5)

## 2015-09-08 LAB — POC URINE PREG, ED: Preg Test, Ur: NEGATIVE

## 2015-09-08 NOTE — ED Triage Notes (Signed)
Pt states "I know I am pregnant by my intuition. I am only 2-3 weeks but I can already feel my uterus stretching so I know it is multiples"; pt had a twin pregnancy in 2015; pt states "I am high risk and they are supposed to put a plug in me so I can carry these 2, 3, 4 or 5 babies"; pt states that she has taken a pregnancy test and that it was negative; pt states "I know it is negative but I know I am pregnant"; pt c/o feeling fatigued, nausea and reports that she is short of breath at times; pt states that she knows that these are all pregnancy symptoms; pt denies vaginal bleeding or discharge; pt states that she recently had BV and believes she has a yeast infection; pt reports white vaginal discharge

## 2015-09-09 ENCOUNTER — Emergency Department (HOSPITAL_COMMUNITY): Payer: Self-pay

## 2015-09-09 LAB — WET PREP, GENITAL
Clue Cells Wet Prep HPF POC: NONE SEEN
SPERM: NONE SEEN
Trich, Wet Prep: NONE SEEN
Yeast Wet Prep HPF POC: NONE SEEN

## 2015-09-09 MED ORDER — POLYETHYLENE GLYCOL 3350 17 GM/SCOOP PO POWD
17.0000 g | Freq: Every day | ORAL | 0 refills | Status: AC
Start: 2015-09-09 — End: ?

## 2015-09-09 MED ORDER — POLYETHYLENE GLYCOL 3350 17 G PO PACK
17.0000 g | PACK | Freq: Every day | ORAL | Status: DC
  Administered 2015-09-09: 17 g via ORAL
  Filled 2015-09-09: qty 1

## 2015-09-09 MED ORDER — ONDANSETRON 4 MG PO TBDP: 4.0000 mg | ORAL_TABLET | Freq: Three times a day (TID) | ORAL | 0 refills | Status: AC | PRN

## 2015-09-09 MED ORDER — ONDANSETRON 4 MG PO TBDP
4.0000 mg | ORAL_TABLET | Freq: Once | ORAL | Status: AC
  Administered 2015-09-09: 4 mg via ORAL
  Filled 2015-09-09: qty 1

## 2015-09-09 NOTE — ED Notes (Signed)
MD at bedside. 
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
## 2015-09-09 NOTE — ED Notes (Signed)
Pt. Refused to be discharged and stated  "unless seen by a Doctor and not just PA' . I need a real Doctor to touch my stomach and tell me that i'm not pregnant." to notify MD/PA.

## 2015-09-09 NOTE — Discharge Instructions (Signed)
Please read and follow all provided instructions.  Your diagnoses today include:  1. Abdominal cramps   2. Pain in the abdomen    Tests performed today include:  Blood counts and electrolytes  Blood tests to check liver and kidney function  Urine test to look for infection and pregnancy (in women) - normal  Blood test for pregnancy - negative  Ultrasound  Wet prep for vaginal infection  Vital signs. See below for your results today.   Medications prescribed:   None  Take any prescribed medications only as directed.  Home care instructions:   Follow any educational materials contained in this packet.  Follow-up instructions: Please follow-up with your primary care provider in the next 2 days for further evaluation of your symptoms.    Return instructions:  SEEK IMMEDIATE MEDICAL ATTENTION IF:  The pain does not go away or becomes severe   A temperature above 101F develops   Repeated vomiting occurs (multiple episodes)   The pain becomes localized to portions of the abdomen. The right side could possibly be appendicitis. In an adult, the left lower portion of the abdomen could be colitis or diverticulitis.   Blood is being passed in stools or vomit (bright red or black tarry stools)   You develop chest pain, difficulty breathing, dizziness or fainting, or become confused, poorly responsive, or inconsolable (young children)  If you have any other emergent concerns regarding your health  Additional Information: Abdominal (belly) pain can be caused by many things. Your caregiver performed an examination and possibly ordered blood/urine tests and imaging (CT scan, x-rays, ultrasound). Many cases can be observed and treated at home after initial evaluation in the emergency department. Even though you are being discharged home, abdominal pain can be unpredictable. Therefore, you need a repeated exam if your pain does not resolve, returns, or worsens. Most patients with  abdominal pain don't have to be admitted to the hospital or have surgery, but serious problems like appendicitis and gallbladder attacks can start out as nonspecific pain. Many abdominal conditions cannot be diagnosed in one visit, so follow-up evaluations are very important.  Your vital signs today were: BP (!) 147/112 (BP Location: Right Arm)    Pulse 79    Temp 98.4 F (36.9 C) (Oral)    Resp 18    Ht 5\' 1"  (1.549 m)    LMP 08/18/2015    SpO2 100%  If your blood pressure (bp) was elevated above 135/85 this visit, please have this repeated by your doctor within one month. --------------

## 2015-09-10 LAB — GC/CHLAMYDIA PROBE AMP (~~LOC~~) NOT AT ARMC
Chlamydia: NEGATIVE
Neisseria Gonorrhea: NEGATIVE

## 2015-09-27 ENCOUNTER — Encounter (HOSPITAL_COMMUNITY): Payer: Self-pay | Admitting: Emergency Medicine

## 2015-09-27 ENCOUNTER — Emergency Department (HOSPITAL_COMMUNITY): Payer: Self-pay

## 2015-09-27 ENCOUNTER — Emergency Department (HOSPITAL_COMMUNITY)
Admission: EM | Admit: 2015-09-27 | Discharge: 2015-09-28 | Disposition: A | Discharge status: Home / Self Care | Payer: Self-pay | Attending: Emergency Medicine | Admitting: Emergency Medicine

## 2015-09-27 DIAGNOSIS — Y939 Activity, unspecified: Secondary | ICD-10-CM | POA: Insufficient documentation

## 2015-09-27 DIAGNOSIS — Z23 Encounter for immunization: Secondary | ICD-10-CM | POA: Insufficient documentation

## 2015-09-27 DIAGNOSIS — T148XXA Other injury of unspecified body region, initial encounter: Secondary | ICD-10-CM

## 2015-09-27 DIAGNOSIS — S61204A Unspecified open wound of right ring finger without damage to nail, initial encounter: Secondary | ICD-10-CM | POA: Insufficient documentation

## 2015-09-27 DIAGNOSIS — S61206A Unspecified open wound of right little finger without damage to nail, initial encounter: Secondary | ICD-10-CM | POA: Insufficient documentation

## 2015-09-27 DIAGNOSIS — Y92828 Other wilderness area as the place of occurrence of the external cause: Secondary | ICD-10-CM | POA: Insufficient documentation

## 2015-09-27 DIAGNOSIS — Y999 Unspecified external cause status: Secondary | ICD-10-CM | POA: Insufficient documentation

## 2015-09-27 DIAGNOSIS — Z87891 Personal history of nicotine dependence: Secondary | ICD-10-CM | POA: Insufficient documentation

## 2015-09-27 DIAGNOSIS — S60221A Contusion of right hand, initial encounter: Secondary | ICD-10-CM | POA: Insufficient documentation

## 2015-09-27 DIAGNOSIS — W04XXXA Fall while being carried or supported by other persons, initial encounter: Secondary | ICD-10-CM | POA: Insufficient documentation

## 2015-09-27 MED ORDER — TETANUS-DIPHTH-ACELL PERTUSSIS 5-2.5-18.5 LF-MCG/0.5 IM SUSP
0.5000 mL | Freq: Once | INTRAMUSCULAR | Status: DC
  Filled 2015-09-27: qty 0.5
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
## 2015-09-27 NOTE — Discharge Instructions (Signed)
Apply the medication to the affected areas 2 times daily.
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
## 2017-01-18 IMAGING — US US TRANSVAGINAL NON-OB
1 series · 14 of 25 positions shown · non-contrast
Comparison: None

CLINICAL DATA: Abdominal pain and cramping for 1 week.  Swelling.

EXAM:
TRANSABDOMINAL AND TRANSVAGINAL ULTRASOUND OF PELVIS
TECHNIQUE: Both transabdominal and transvaginal ultrasound examinations of the
pelvis were performed. Transabdominal technique was performed for
global imaging of the pelvis including uterus, ovaries, adnexal
regions, and pelvic cul-de-sac. It was necessary to proceed with
endovaginal exam following the transabdominal exam to visualize the
ovaries and adnexa.

[Series 1: us transvaginal non-ob · 0.20mm/px · 14 of 38 slices shown]
[im 1/38]
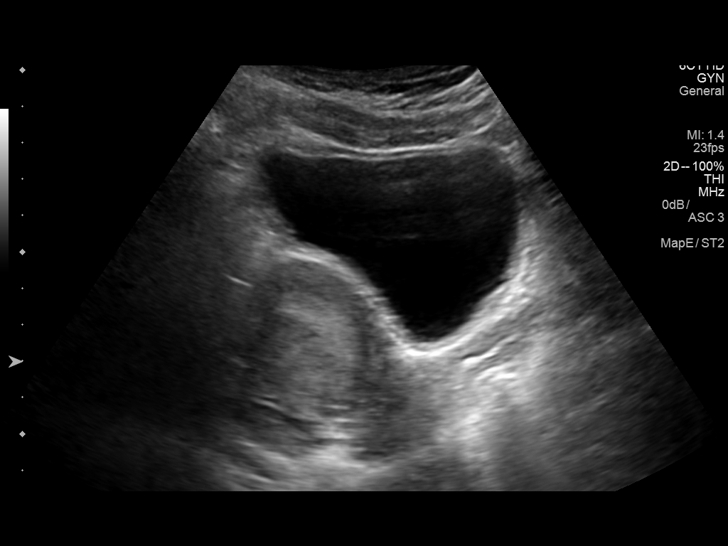
[im 4/38]
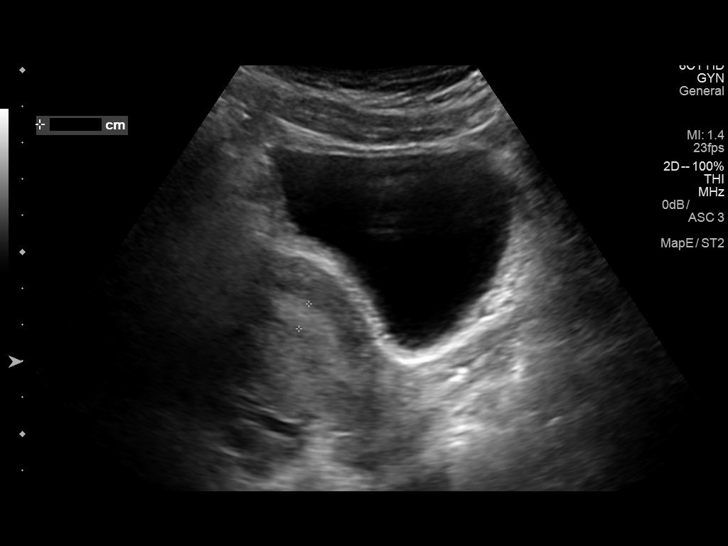
[im 7/38]
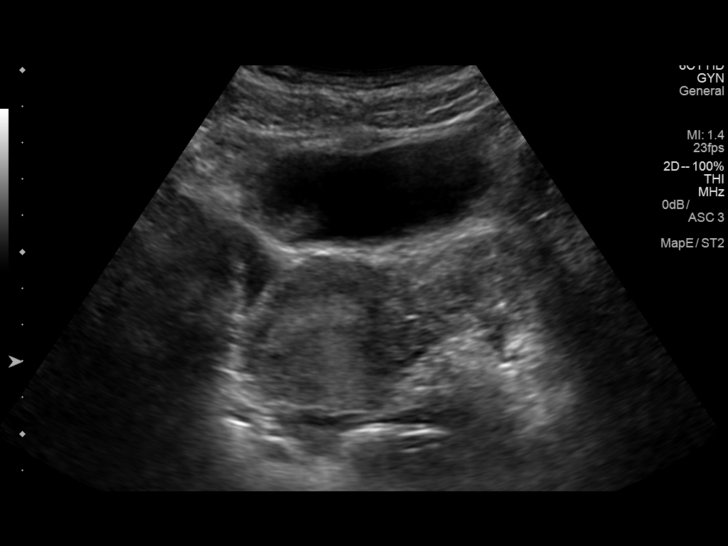
[im 10/38]
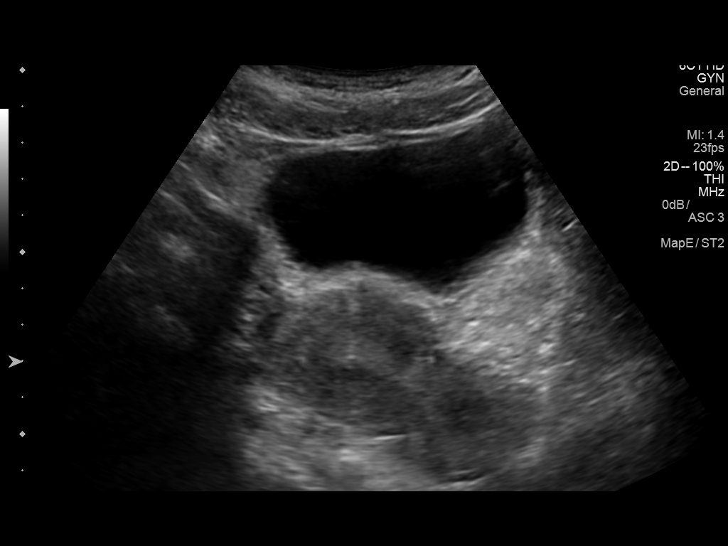
[im 13/38]
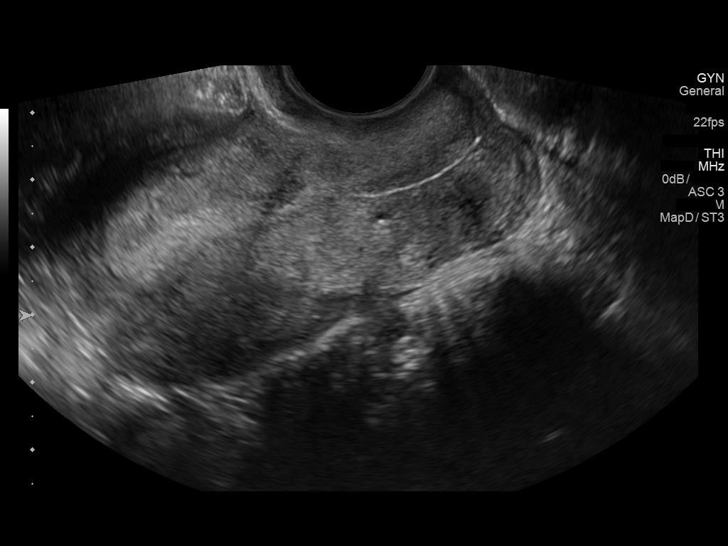
[im 14/38]
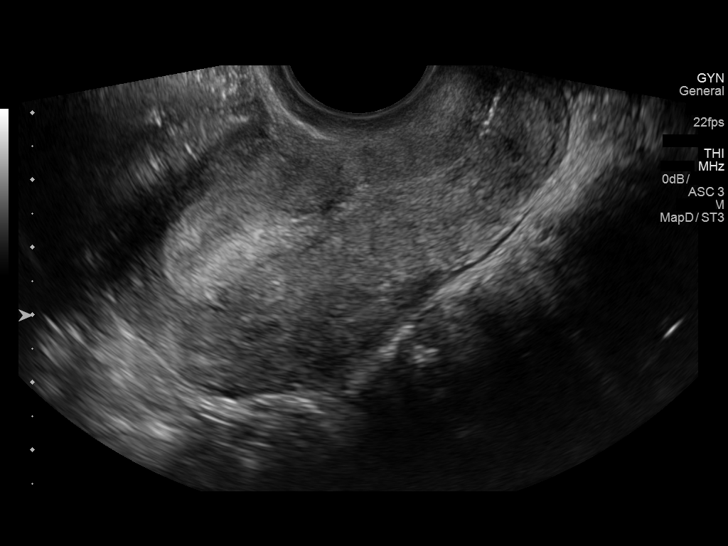
[im 17/38]
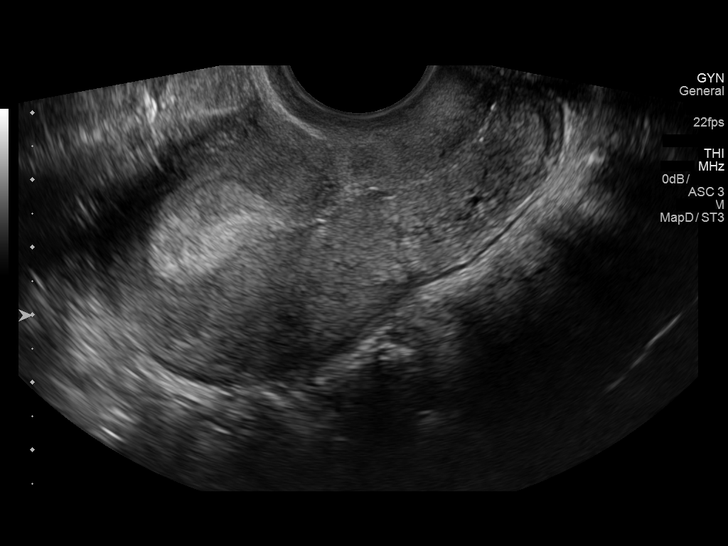
[im 21/38]
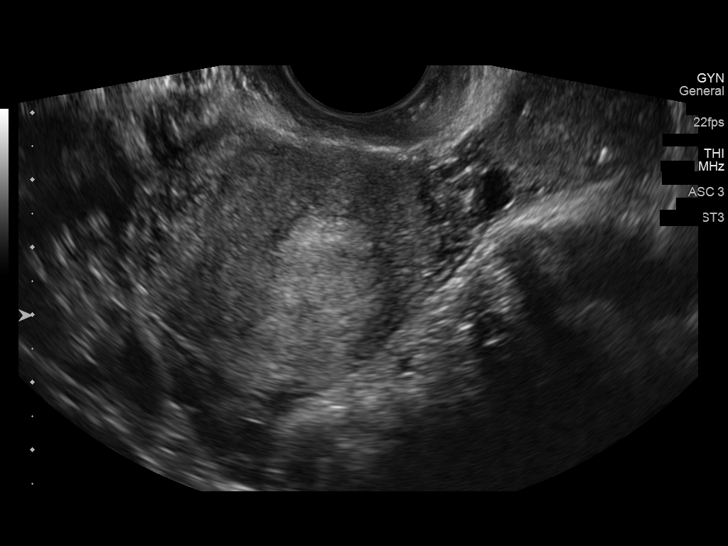
[im 24/38]
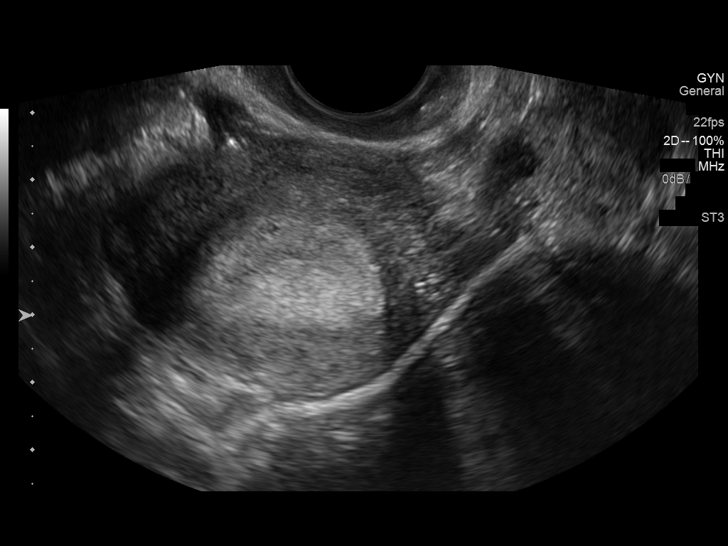
[im 25/38]
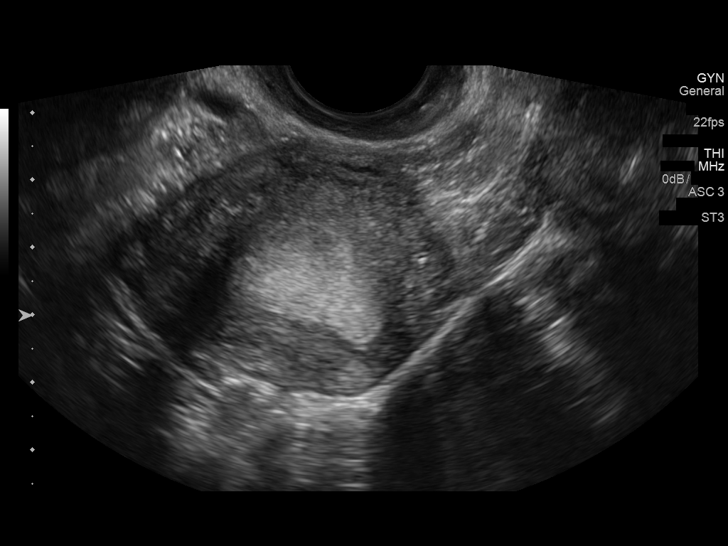
[im 28/38]
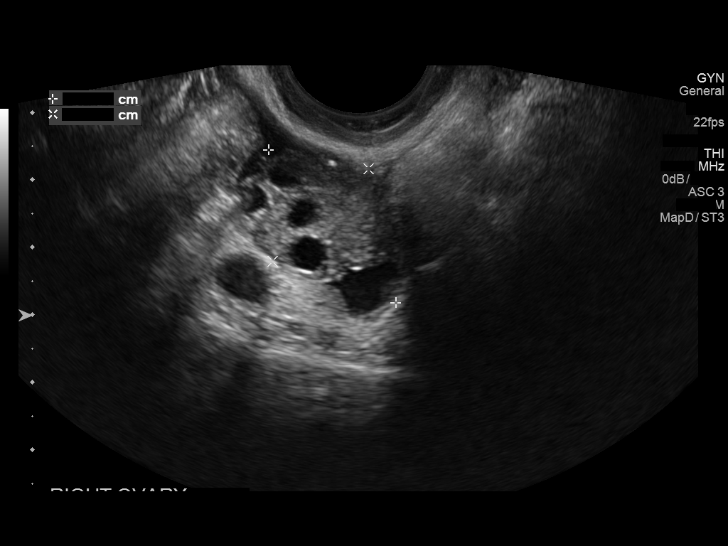
[im 31/38]
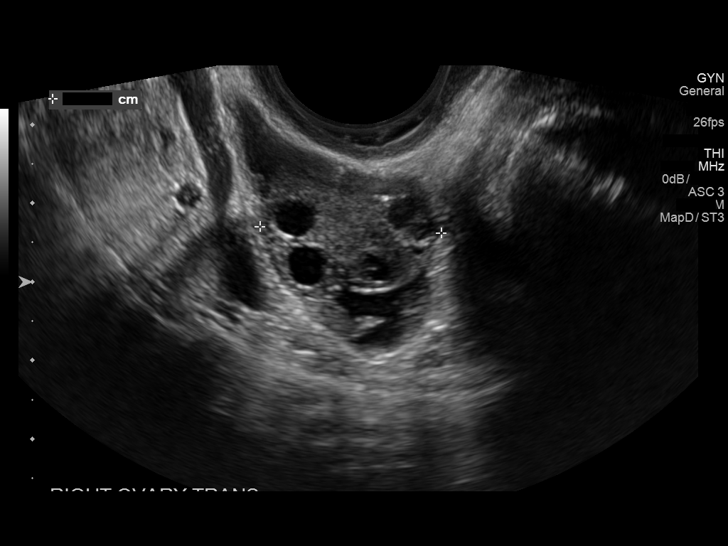
[im 34/38]
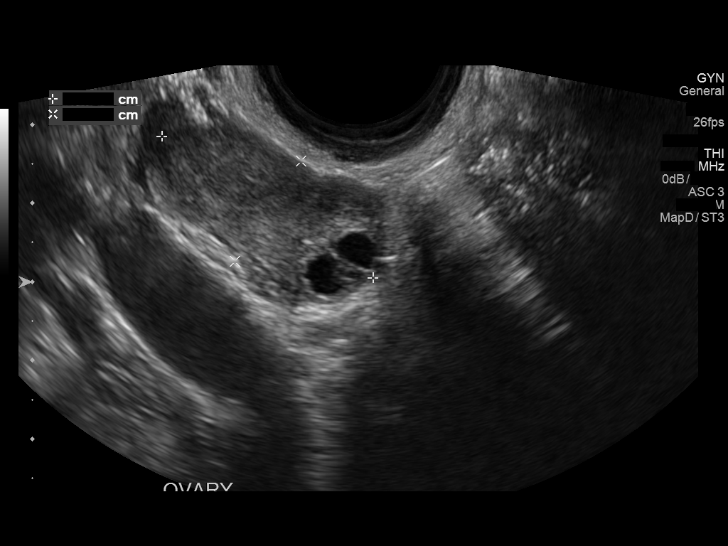
[im 38/38]
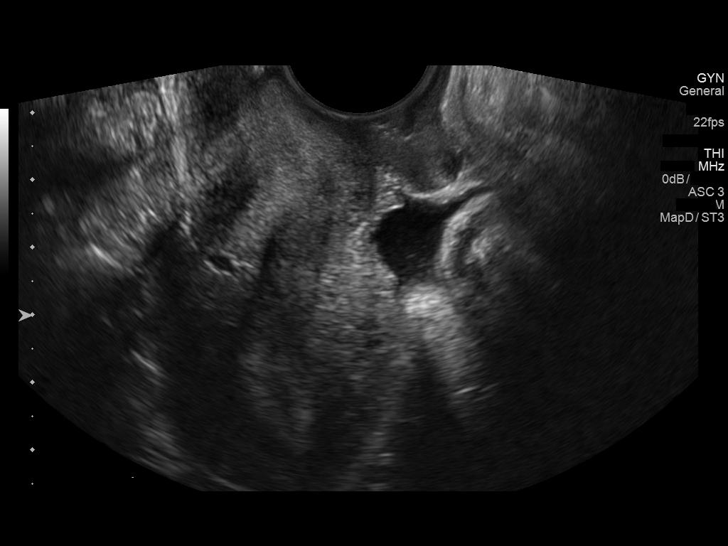

[14 of 25 positions shown; findings below may reference images not displayed]

FINDINGS: Uterus

Measurements: 7.2 x 4.3 x 5.2 cm. No fibroids or other mass
visualized.

Endometrium

Thickness: 11.4 mm.  No focal abnormality visualized.

Right ovary

Measurements: 3.0 x 2.0 x 2.3 cm. Normal appearance/no adnexal mass.
Physiologic follicles. Normal blood flow.

Left ovary

Measurements: 3.2 x 1.5 x 2.6 cm. Normal appearance/no adnexal mass.
Physiologic follicles. Normal blood flow.

Other findings

Small volume of free fluid in the pelvis physiologic.
IMPRESSION: Normal pelvic ultrasound.
